# Patient Record
Sex: Female | Born: 2000 | Race: White | Hispanic: No | Marital: Single | Smoking: Never smoker
Health system: Southern US, Community
[De-identification: ages and names within clinical notes are randomized; demographics above are authoritative.]

## PROBLEM LIST (undated history)

## (undated) DIAGNOSIS — Z789 Other specified health status: Secondary | ICD-10-CM

## (undated) HISTORY — PX: NO PAST SURGERIES: SHX2092

## (undated) HISTORY — DX: Other specified health status: Z78.9

---
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
## 2021-01-03 NOTE — Telephone Encounter (Signed)
Informed mother verbalized understanding 

## 2021-01-16 ENCOUNTER — Ambulatory Visit: Payer: Medicaid Other | Admitting: Pediatrics

## 2021-01-24 ENCOUNTER — Ambulatory Visit (INDEPENDENT_AMBULATORY_CARE_PROVIDER_SITE_OTHER): Payer: Medicaid Other | Admitting: Pediatrics

## 2021-01-24 ENCOUNTER — Other Ambulatory Visit: Payer: Self-pay

## 2021-01-24 ENCOUNTER — Encounter: Payer: Self-pay | Admitting: Pediatrics

## 2021-01-24 VITALS — BP 123/77 | HR 83 | Ht 65.16 in | Wt 187.8 lb

## 2021-01-24 DIAGNOSIS — Z87440 Personal history of urinary (tract) infections: Secondary | ICD-10-CM | POA: Diagnosis not present

## 2021-01-24 DIAGNOSIS — K219 Gastro-esophageal reflux disease without esophagitis: Secondary | ICD-10-CM | POA: Diagnosis not present

## 2021-01-24 DIAGNOSIS — R141 Gas pain: Secondary | ICD-10-CM

## 2021-01-24 DIAGNOSIS — J3089 Other allergic rhinitis: Secondary | ICD-10-CM | POA: Diagnosis not present

## 2021-01-24 DIAGNOSIS — B349 Viral infection, unspecified: Secondary | ICD-10-CM

## 2021-01-24 DIAGNOSIS — J069 Acute upper respiratory infection, unspecified: Secondary | ICD-10-CM

## 2021-01-24 DIAGNOSIS — R1084 Generalized abdominal pain: Secondary | ICD-10-CM | POA: Diagnosis not present

## 2021-01-24 LAB — POCT URINALYSIS DIPSTICK (MANUAL)
Leukocytes, UA: NEGATIVE
Nitrite, UA: NEGATIVE
Poct Bilirubin: NEGATIVE
Poct Blood: NEGATIVE
Poct Glucose: NORMAL mg/dL
Poct Ketones: NEGATIVE
Poct Urobilinogen: NORMAL mg/dL
Spec Grav, UA: 1.025 (ref 1.010–1.025)
pH, UA: 6 (ref 5.0–8.0)

## 2021-01-24 LAB — POCT INFLUENZA A: Rapid Influenza A Ag: NEGATIVE

## 2021-01-24 LAB — POC SOFIA SARS ANTIGEN FIA: SARS Coronavirus 2 Ag: NEGATIVE

## 2021-01-24 LAB — POCT INFLUENZA B: Rapid Influenza B Ag: NEGATIVE

## 2021-01-24 MED ORDER — LORATADINE 10 MG PO TABS
10.0000 mg | ORAL_TABLET | Freq: Every day | ORAL | 5 refills | Status: DC
Start: 2021-01-24 — End: 2021-04-19

## 2021-01-24 MED ORDER — LANSOPRAZOLE 30 MG PO CPDR: 30.0000 mg | DELAYED_RELEASE_CAPSULE | Freq: Every day | ORAL | 2 refills | Status: DC

## 2021-01-24 MED ORDER — FLUTICASONE PROPIONATE 50 MCG/ACT NA SUSP
1.0000 | Freq: Every day | NASAL | 5 refills | Status: DC
Start: 2021-01-24 — End: 2022-09-09

## 2021-01-24 NOTE — Progress Notes (Signed)
Patient is the primary historian during today's visit.  Subjective:    Mary Morgan  is a 20 y.o. who presents for recheck of urine in addition to new symptoms of nasal congestion, headache and intermittent abdominal pain. Patient was treated for BV and notes improvement in vaginal discharge.  Patient states that her headache and nasal congestion started 1 week ago, comes and goes, usually with frontal dull pain.   Patient continues to have generalized abdominal pain, sometimes localized to the lower abdomen. Patient started on reflux medication but does not consistently take it. Patient notes that her stools are normal, no constipation.   History reviewed. No pertinent past medical history.   Past Surgical History:  Procedure Laterality Date  . NO PAST SURGERIES       Family History  Problem Relation Age of Onset  . Migraines Mother   . Anxiety disorder Mother   . Depression Mother   . Bipolar disorder Mother   . Schizophrenia Mother   . Migraines Brother   . ADD / ADHD Brother   . Seizures Neg Hx   . Autism Neg Hx     Current Meds  Medication Sig  . amitriptyline (ELAVIL) 25 MG tablet Take 1 tablet (25 mg total) by mouth at bedtime.  Marland Kitchen etonogestrel (NEXPLANON) 68 MG IMPL implant 1 each by Subdermal route once.  . fluticasone (FLONASE) 50 MCG/ACT nasal spray Place 1 spray into both nostrils daily.  Marland Kitchen loratadine (CLARITIN) 10 MG tablet Take 10 mg by mouth as needed.   . loratadine (CLARITIN) 10 MG tablet Take 1 tablet (10 mg total) by mouth daily.  . Multiple Vitamin (MULTIVITAMIN) tablet Take 1 tablet by mouth daily.  . naproxen (NAPROSYN) 500 MG tablet Take 1 tablet (500 mg total) by mouth 2 (two) times daily with a meal.  . [DISCONTINUED] lansoprazole (PREVACID) 30 MG capsule Take 1 capsule (30 mg total) by mouth daily at 12 noon.       Allergies  Allergen Reactions  . Banana Shortness Of Breath  . Penicillins Shortness Of Breath  . Elemental Sulfur Rash    Review  of Systems  Constitutional: Negative.  Negative for fever and malaise/fatigue.  HENT: Positive for congestion. Negative for ear pain and sore throat.   Eyes: Negative.   Respiratory: Negative.  Negative for cough.   Cardiovascular: Negative.  Negative for chest pain.  Gastrointestinal: Positive for abdominal pain. Negative for blood in stool, constipation, diarrhea and vomiting.  Genitourinary: Negative for dysuria, flank pain, hematuria and urgency.  Skin: Negative.  Negative for rash.  Neurological: Positive for headaches. Negative for dizziness.     Objective:   Blood pressure 123/77, pulse 83, height 5' 5.16" (1.655 m), weight 187 lb 12.8 oz (85.2 kg), last menstrual period 01/11/2021, SpO2 98 %.  Physical Exam Constitutional:      General: She is not in acute distress.    Appearance: Normal appearance.  HENT:     Head: Normocephalic and atraumatic.     Right Ear: Tympanic membrane, ear canal and external ear normal.     Left Ear: Tympanic membrane, ear canal and external ear normal.     Nose: Congestion present. No rhinorrhea.     Comments: No sinus tenderness    Mouth/Throat:     Mouth: Mucous membranes are moist.     Pharynx: Oropharynx is clear. No oropharyngeal exudate or posterior oropharyngeal erythema.  Eyes:     Conjunctiva/sclera: Conjunctivae normal.     Pupils: Pupils  are equal, round, and reactive to light.  Cardiovascular:     Rate and Rhythm: Normal rate and regular rhythm.     Heart sounds: Normal heart sounds.  Pulmonary:     Effort: Pulmonary effort is normal.     Breath sounds: Normal breath sounds.  Abdominal:     General: Bowel sounds are normal. There is no distension.     Palpations: Abdomen is soft.     Tenderness: There is no abdominal tenderness. There is no right CVA tenderness or left CVA tenderness.  Musculoskeletal:        General: Normal range of motion.     Cervical back: Normal range of motion and neck supple.  Lymphadenopathy:      Cervical: No cervical adenopathy.  Skin:    General: Skin is warm.  Neurological:     General: No focal deficit present.     Mental Status: She is alert.     Gait: Gait is intact.  Psychiatric:        Mood and Affect: Mood and affect normal.        Behavior: Behavior normal.      IN-HOUSE Laboratory Results:    Results for orders placed or performed in visit on 01/24/21  POC SOFIA Antigen FIA  Result Value Ref Range   SARS Coronavirus 2 Ag Negative Negative  POCT Influenza B  Result Value Ref Range   Rapid Influenza B Ag negative   POCT Influenza A  Result Value Ref Range   Rapid Influenza A Ag negative   POCT Urinalysis Dip Manual  Result Value Ref Range   Spec Grav, UA 1.025 1.010 - 1.025   pH, UA 6.0 5.0 - 8.0   Leukocytes, UA Negative Negative   Nitrite, UA Negative Negative   Poct Protein trace Negative, trace mg/dL   Poct Glucose Normal Normal mg/dL   Poct Ketones Negative Negative   Poct Urobilinogen Normal Normal mg/dL   Poct Bilirubin Negative Negative   Poct Blood Negative Negative, trace     Assessment:    History of urinary tract infection - Plan: POCT Urinalysis Dip Manual, Upper Respiratory Culture, Routine  Viral illness - Plan: POC SOFIA Antigen FIA, POCT Influenza B, POCT Influenza A  Gas pain  Generalized abdominal pain - Plan: DG Abd 2 Views  Seasonal allergic rhinitis due to other allergic trigger - Plan: loratadine (CLARITIN) 10 MG tablet, fluticasone (FLONASE) 50 MCG/ACT nasal spray  Gastroesophageal reflux disease without esophagitis - Plan: lansoprazole (PREVACID) 30 MG capsule  Plan:   UA WNL today. Will follow culture.   Discussed with patient the importance of compliance with medication. Advised restarting reflux medication and take daily. Will send for abdominal XR and will follow.   Discussed about allergic rhinitis. Advised family to make sure child changes clothing and washes hands/face when returning from outdoors. Air  purifier should be used. Will start on allergy medication today. This type of medication should be used every day regardless of symptoms, not on an as-needed basis. It typically takes 1 to 2 weeks to see a response.  Nasal saline may be used for congestion and to thin the secretions for easier mobilization of the secretions. A cool mist humidifier may be used. Increase the amount of fluids the child is taking in to improve hydration. Tylenol may be used as directed on the bottle. Rest is critically important to enhance the healing process and is encouraged by limiting activities.   Meds ordered this encounter  Medications  . lansoprazole (PREVACID) 30 MG capsule    Sig: Take 1 capsule (30 mg total) by mouth daily at 12 noon.    Dispense:  30 capsule    Refill:  2  . loratadine (CLARITIN) 10 MG tablet    Sig: Take 1 tablet (10 mg total) by mouth daily.    Dispense:  30 tablet    Refill:  5  . fluticasone (FLONASE) 50 MCG/ACT nasal spray    Sig: Place 1 spray into both nostrils daily.    Dispense:  16 g    Refill:  5   Advised patient to trial on Gas pills for abdominal pain.   Orders Placed This Encounter  Procedures  . Upper Respiratory Culture, Routine  . DG Abd 2 Views  . POC SOFIA Antigen FIA  . POCT Influenza B  . POCT Influenza A  . POCT Urinalysis Dip Manual

## 2021-01-25 ENCOUNTER — Ambulatory Visit (HOSPITAL_COMMUNITY)
Admission: RE | Admit: 2021-01-25 | Discharge: 2021-01-25 | Disposition: A | Discharge status: Home / Self Care | Payer: Medicaid Other | Source: Ambulatory Visit | Attending: Pediatrics | Admitting: Pediatrics

## 2021-01-25 ENCOUNTER — Encounter (HOSPITAL_COMMUNITY): Payer: Self-pay

## 2021-01-25 DIAGNOSIS — R1084 Generalized abdominal pain: Secondary | ICD-10-CM | POA: Diagnosis not present

## 2021-01-25 DIAGNOSIS — R109 Unspecified abdominal pain: Secondary | ICD-10-CM | POA: Diagnosis not present

## 2021-01-25 NOTE — Patient Instructions (Signed)

## 2021-01-26 ENCOUNTER — Telehealth: Payer: Self-pay | Admitting: Pediatrics

## 2021-01-26 LAB — UPPER RESPIRATORY CULTURE, ROUTINE

## 2021-01-26 NOTE — Telephone Encounter (Signed)
Informed patient; verbalized understanding.

## 2021-01-26 NOTE — Telephone Encounter (Signed)
Please advise patient that her abdomen XR return normal, without any abnormality of note.

## 2021-01-29 LAB — URINE CULTURE

## 2021-01-29 LAB — SPECIMEN STATUS REPORT

## 2021-02-01 ENCOUNTER — Telehealth: Payer: Self-pay | Admitting: Pediatrics

## 2021-02-01 NOTE — Telephone Encounter (Signed)
Informed mother verbalized understanding 

## 2021-02-01 NOTE — Telephone Encounter (Signed)
patient

## 2021-02-01 NOTE — Telephone Encounter (Signed)
Please advise patient that her urine culture returned negative for infection. Urine revealed a mixed flora, which could mean that it was contaminated. We will repeat her urine culture when she returns in June. Thank you.

## 2021-02-01 NOTE — Telephone Encounter (Signed)
Left message to return call 

## 2021-02-15 ENCOUNTER — Encounter: Payer: Self-pay | Admitting: Pediatrics

## 2021-03-19 ENCOUNTER — Other Ambulatory Visit: Payer: Self-pay

## 2021-03-19 ENCOUNTER — Encounter (HOSPITAL_COMMUNITY): Payer: Self-pay | Admitting: *Deleted

## 2021-03-19 ENCOUNTER — Emergency Department (HOSPITAL_COMMUNITY): Payer: Medicaid Other

## 2021-03-19 ENCOUNTER — Emergency Department (HOSPITAL_COMMUNITY)
Admission: EM | Admit: 2021-03-19 | Discharge: 2021-03-19 | Disposition: A | Discharge status: Home / Self Care | Payer: Medicaid Other | Attending: Emergency Medicine | Admitting: Emergency Medicine

## 2021-03-19 DIAGNOSIS — R5381 Other malaise: Secondary | ICD-10-CM | POA: Diagnosis not present

## 2021-03-19 DIAGNOSIS — Y92410 Unspecified street and highway as the place of occurrence of the external cause: Secondary | ICD-10-CM | POA: Insufficient documentation

## 2021-03-19 DIAGNOSIS — M25522 Pain in left elbow: Secondary | ICD-10-CM | POA: Insufficient documentation

## 2021-03-19 DIAGNOSIS — R531 Weakness: Secondary | ICD-10-CM | POA: Diagnosis not present

## 2021-03-19 DIAGNOSIS — M79602 Pain in left arm: Secondary | ICD-10-CM | POA: Diagnosis not present

## 2021-03-19 DIAGNOSIS — M25512 Pain in left shoulder: Secondary | ICD-10-CM | POA: Diagnosis not present

## 2021-03-19 DIAGNOSIS — M545 Low back pain, unspecified: Secondary | ICD-10-CM | POA: Diagnosis not present

## 2021-03-19 DIAGNOSIS — M546 Pain in thoracic spine: Secondary | ICD-10-CM | POA: Insufficient documentation

## 2021-03-19 DIAGNOSIS — Z041 Encounter for examination and observation following transport accident: Secondary | ICD-10-CM | POA: Diagnosis not present

## 2021-03-19 DIAGNOSIS — M549 Dorsalgia, unspecified: Secondary | ICD-10-CM

## 2021-03-19 MED ORDER — METHOCARBAMOL 500 MG PO TABS: 500.0000 mg | ORAL_TABLET | Freq: Every evening | ORAL | 0 refills | Status: DC | PRN

## 2021-03-19 MED ORDER — ACETAMINOPHEN 500 MG PO TABS
1000.0000 mg | ORAL_TABLET | Freq: Once | ORAL | Status: AC
  Administered 2021-03-19: 1000 mg via ORAL
  Filled 2021-03-19: qty 2
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
## 2021-03-19 NOTE — Discharge Instructions (Addendum)
Take ibuprofen 3 times a day with meals.  Do not take other anti-inflammatories at the same time (Advil, Motrin, naproxen, Aleve). You may supplement with Tylenol if you need further pain control. °Use robaxin as needed for muscle stiffness or soreness.  Have caution, this may make you tired or groggy.  Do not drive or operate heavy machinery while taking this medicine. °Use ice packs or heating pads if this helps control your pain. °You will likely have continued muscle stiffness and soreness over the next couple days.  Follow-up with primary care in 1 week if your symptoms are not improving. °Return to the emergency room if you develop vision changes, vomiting, slurred speech, numbness, loss of bowel or bladder control, or any new or worsening symptoms. ° °
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
## 2021-03-20 ENCOUNTER — Telehealth: Payer: Self-pay | Admitting: *Deleted

## 2021-03-20 NOTE — Telephone Encounter (Signed)
Transition Care Management Follow-up Telephone Call  Date of discharge and from where: 03/19/2021 - [facility]  How have you been since you were released from the hospital? "Still really sore"  Any questions or concerns? No  Items Reviewed:  Did the pt receive and understand the discharge instructions provided? Yes   Medications obtained and verified? Yes   Other? No   Any new allergies since your discharge? No   Dietary orders reviewed? No  Do you have support at home? Yes    Functional Questionnaire: (I = Independent and D = Dependent) ADLs: I  Bathing/Dressing- I  Meal Prep- I  Eating- I  Maintaining continence- I  Transferring/Ambulation- I  Managing Meds- I  Follow up appointments reviewed:   PCP Hospital f/u appt confirmed? No  - has routine appointment in June  Specialist Hospital f/u appt confirmed? No    Are transportation arrangements needed? No   If their condition worsens, is the pt aware to call PCP or go to the Emergency Dept.? Yes  Was the patient provided with contact information for the PCP's office or ED? Yes  Was to pt encouraged to call back with questions or concerns? Yes

## 2021-04-18 ENCOUNTER — Encounter: Payer: Self-pay | Admitting: Pediatrics

## 2021-04-18 ENCOUNTER — Other Ambulatory Visit: Payer: Self-pay

## 2021-04-18 ENCOUNTER — Ambulatory Visit (INDEPENDENT_AMBULATORY_CARE_PROVIDER_SITE_OTHER): Payer: Medicaid Other | Admitting: Pediatrics

## 2021-04-18 VITALS — BP 112/74 | HR 89 | Ht 64.88 in | Wt 174.6 lb

## 2021-04-18 DIAGNOSIS — Z713 Dietary counseling and surveillance: Secondary | ICD-10-CM

## 2021-04-18 DIAGNOSIS — Z0000 Encounter for general adult medical examination without abnormal findings: Secondary | ICD-10-CM | POA: Diagnosis not present

## 2021-04-18 DIAGNOSIS — K219 Gastro-esophageal reflux disease without esophagitis: Secondary | ICD-10-CM

## 2021-04-18 DIAGNOSIS — Z23 Encounter for immunization: Secondary | ICD-10-CM

## 2021-04-18 DIAGNOSIS — Z7251 High risk heterosexual behavior: Secondary | ICD-10-CM | POA: Diagnosis not present

## 2021-04-18 DIAGNOSIS — K588 Other irritable bowel syndrome: Secondary | ICD-10-CM

## 2021-04-18 DIAGNOSIS — Z87440 Personal history of urinary (tract) infections: Secondary | ICD-10-CM | POA: Diagnosis not present

## 2021-04-18 LAB — POCT URINALYSIS DIPSTICK (MANUAL)
Leukocytes, UA: NEGATIVE
Nitrite, UA: NEGATIVE
Poct Bilirubin: NEGATIVE
Poct Glucose: NORMAL mg/dL
Poct Urobilinogen: NORMAL mg/dL
Spec Grav, UA: 1.025 (ref 1.010–1.025)
pH, UA: 6 (ref 5.0–8.0)

## 2021-04-18 LAB — POCT URINE PREGNANCY: Preg Test, Ur: NEGATIVE

## 2021-04-18 NOTE — Progress Notes (Signed)
Mary Morgan is a 20 y.o. who presents for a well check. Patient is accompanied by Mary Morgan. Patient is the primary historian during today's visit.   SUBJECTIVE:  CONCERNS:   Patient continues to have recurrent epigastric abdominal pain. Pain has mild improvement with reflux medication. Patient notes that food continues to make her nauseous, sometimes she wakes up and then has abdominal pain (does not wake her from sleep). Patient also notes that her abdominal pain worsens with change in mood. Patient will have loose bowel movements but has also noticed mucus in her stools.   NUTRITION:   Milk:  None Soda/Juice/Gatorade:  None Water:  2-3 cups Solids:  Eats fruits, some vegetables, meats  EXERCISE:  None  ELIMINATION:  Voids multiple times a day; Firm stools sometimes loose    MENSTRUAL HISTORY:    Cycle:  regular Flow:  heavy for 2-3 days Duration of menses: 5 days  HOME LIFE:      Patient lives alone with Mary. Feels safe at home. No guns in the house.  SLEEP:   8 hours SAFETY:  Wears seat belt all the time.   PEER RELATIONS:  Socializes well. (+) Social media  PHQ-9 Adolescent: PHQ-Adolescent 04/18/2021  Down, depressed, hopeless 2  Decreased interest 1  Altered sleeping 0  Change in appetite 2  Tired, decreased energy 0  Feeling bad or failure about yourself 0  Trouble concentrating 1  Moving slowly or fidgety/restless 0  Suicidal thoughts 0  PHQ-Adolescent Score 6  In the past year have you felt depressed or sad most days, even if you felt okay sometimes? No  If you are experiencing any of the problems on this form, how difficult have these problems made it for you to do your work, take care of things at home or get along with other people? Somewhat difficult  Has there been a time in the past month when you have had serious thoughts about ending your own life? No  Have you ever, in your whole life, tried to kill yourself or made a suicide attempt? No       DEVELOPMENT:  SCHOOL: Graduated, no plans at this time DRIVING:  yes  Social History   Tobacco Use   Smoking status: Never   Smokeless tobacco: Never  Vaping Use   Vaping Use: Former  Substance Use Topics   Alcohol use: Not Currently   Drug use: Never    Social History   Substance and Sexual Activity  Sexual Activity Yes   Birth control/protection: Implant   Comment: Heterosexual    History reviewed. No pertinent past medical history.   Past Surgical History:  Procedure Laterality Date   NO PAST SURGERIES       Family History  Problem Relation Age of Onset   Migraines Mother    Anxiety disorder Mother    Depression Mother    Bipolar disorder Mother    Schizophrenia Mother    Migraines Brother    ADD / ADHD Brother    Seizures Neg Hx    Autism Neg Hx     Allergies  Allergen Reactions   Banana Shortness Of Breath   Penicillins Shortness Of Breath   Elemental Sulfur Rash    Current Outpatient Medications  Medication Sig Dispense Refill   amitriptyline (ELAVIL) 25 MG tablet Take 1 tablet (25 mg total) by mouth at bedtime. 30 tablet 4   etonogestrel (NEXPLANON) 68 MG IMPL implant 1 each by Subdermal route once.  fluticasone (FLONASE) 50 MCG/ACT nasal spray Place 1 spray into both nostrils daily. 16 g 5   loratadine (CLARITIN) 10 MG tablet Take 10 mg by mouth as needed.      methocarbamol (ROBAXIN) 500 MG tablet Take 1 tablet (500 mg total) by mouth at bedtime as needed for muscle spasms. 7 tablet 0   Multiple Vitamin (MULTIVITAMIN) tablet Take 1 tablet by mouth daily.     naproxen (NAPROSYN) 500 MG tablet Take 1 tablet (500 mg total) by mouth 2 (two) times daily with a meal. 10 tablet 1   lansoprazole (PREVACID) 30 MG capsule Take 1 capsule (30 mg total) by mouth daily at 12 noon. 30 capsule 11   No current facility-administered medications for this visit.       Review of Systems  Constitutional: Negative.  Negative for activity change and fever.   HENT: Negative.  Negative for ear pain, rhinorrhea and sore throat.   Eyes: Negative.  Negative for pain and redness.  Respiratory: Negative.  Negative for cough and wheezing.   Cardiovascular: Negative.  Negative for chest pain.  Gastrointestinal:  Positive for abdominal pain and diarrhea. Negative for blood in stool and vomiting.  Endocrine: Negative.   Genitourinary:  Negative for difficulty urinating and dysuria.  Musculoskeletal: Negative.  Negative for back pain and joint swelling.  Skin: Negative.  Negative for rash.  Neurological: Negative.   Psychiatric/Behavioral: Negative.  Negative for suicidal ideas.     OBJECTIVE:  Wt Readings from Last 3 Encounters:  04/18/21 174 lb 9.6 oz (79.2 kg) (94 %, Z= 1.53)*  03/19/21 185 lb (83.9 kg) (96 %, Z= 1.73)*  01/24/21 187 lb 12.8 oz (85.2 kg) (96 %, Z= 1.78)*   * Growth percentiles are based on CDC (Girls, 2-20 Years) data.   Ht Readings from Last 3 Encounters:  04/18/21 5' 4.88" (1.648 m) (59 %, Z= 0.24)*  03/19/21 5' 4.5" (1.638 m) (54 %, Z= 0.09)*  01/24/21 5' 5.16" (1.655 m) (64 %, Z= 0.35)*   * Growth percentiles are based on CDC (Girls, 2-20 Years) data.    Body mass index is 29.16 kg/m.   93 %ile (Z= 1.46) based on CDC (Girls, 2-20 Years) BMI-for-age based on BMI available as of 04/18/2021.  VITALS:  Blood pressure 112/74, pulse 89, height 5' 4.88" (1.648 m), weight 174 lb 9.6 oz (79.2 kg), SpO2 100 %.   Hearing Screening   500Hz  1000Hz  2000Hz  3000Hz  4000Hz  5000Hz  6000Hz  8000Hz   Right ear 20 20 20 20 20 20 20 20   Left ear 20 20 20 20 20 20 20 20    Vision Screening   Right eye Left eye Both eyes  Without correction     With correction 20/20 20/20 20/20      PHYSICAL EXAM: GEN:  Alert, active, no acute distress PSYCH:  Mood: pleasant;  Affect:  full range HEENT:  Normocephalic.  Atraumatic. Optic discs sharp bilaterally. Pupils equally round and reactive to light.  Extraoccular muscles intact.  Tympanic canals  clear. Tympanic membranes are pearly gray bilaterally.   Turbinates:  normal ; Tongue midline. No pharyngeal lesions.  Dentition normal. NECK:  Supple. Full range of motion.  No thyromegaly.  No lymphadenopathy. CARDIOVASCULAR:  Normal S1, S2.  No murmurs.   CHEST: Normal shape.  SMR V LUNGS: Clear to auscultation.   ABDOMEN:  Normoactive polyphonic bowel sounds.  No masses.  No hepatosplenomegaly. EXTERNAL GENITALIA:  Normal SMR V EXTREMITIES:  Full ROM. No cyanosis.  No edema. SKIN:  Well perfused.  No rash NEURO:  +5/5 Strength. CN II-XII intact. Normal gait cycle.   SPINE:  No deformities.  No scoliosis.    ASSESSMENT/PLAN:    Tyja is a 20 y.o. teen here for [facility]. Patient is alert, active and in NAD. Passed hearing and vision screen. Growth curve reviewed. Immunizations today.   PHQ-9 reviewed with patient. No suicidal or homicidal ideations.   GC/Ch screen sent. Results will be discussed with patient.    IMMUNIZATIONS:  Handout (VIS) provided for each vaccine for the patient to review during this visit. Indications, benefits, contraindications, and side effects of vaccines discussed with parent.  Patient verbally expressed understanding.  Patient consented to the administration of vaccine/vaccines as ordered today.   Orders Placed This Encounter  Procedures   Chlamydia/GC NAA, Confirmation   Urine Culture   Meningococcal B, OMV (Bexsero)   Ambulatory referral to Gastroenterology   POCT Urinalysis Dip Manual   POCT urine pregnancy   Discussed IBS and GERD with patient. Will continue on oral medication and referral to GI sent.   Meds ordered this encounter  Medications   lansoprazole (PREVACID) 30 MG capsule    Sig: Take 1 capsule (30 mg total) by mouth daily at 12 noon.    Dispense:  30 capsule    Refill:  11   Anticipatory Guidance     - Handout on Young Adult Safety given.      - Discussed growth, diet, and exercise.    - Discussed social media use and limiting  screen time to 2 hours daily.    - Discussed dangers of substance use.    - Discussed lifelong adult responsibility of pregnancy, STDs, and safe sex practices including abstinence.     - Taught self-breast exam.  Taught self-testicular exam.

## 2021-04-19 ENCOUNTER — Encounter: Payer: Self-pay | Admitting: Pediatrics

## 2021-04-19 MED ORDER — LANSOPRAZOLE 30 MG PO CPDR: 30.0000 mg | DELAYED_RELEASE_CAPSULE | Freq: Every day | ORAL | 11 refills | Status: DC

## 2021-04-19 NOTE — Patient Instructions (Signed)
Well Child Nutrition, Teen This sheet provides general nutrition recommendations. Talk with a health care provider or a diet and nutrition specialist (dietitian) if you have any questions. Nutrition The amount of food you need to eat every day depends on your age, sex, size, and activity level. To figure out your daily calorie needs, look for a calorie calculator online or talk with your health care provider. Balanced diet Eat a balanced diet. Try to include: Fruits. Aim for 1-2 cups a day. Examples of 1 cup of fruit include 1 large banana, 1 small apple, 8 large strawberries, or 1 large orange. Try to eat fresh or frozen fruits, and avoid fruits that have added sugars. Vegetables. Aim for 2-3 cups a day. Examples of 1 cup of vegetables include 2 medium carrots, 1 large tomato, or 2 stalks of celery. Try to eat vegetables with a variety of colors. Low-fat dairy. Aim for 3 cups a day. Examples of 1 cup of dairy include 8 oz (230 mL) of milk, 8 oz (230 g) of yogurt, or 1 oz (44 g) of natural cheese. Getting enough calcium and vitamin D is important for growth and healthy bones. Include fat-free or low-fat milk, cheese, and yogurt in your diet. If you are unable to tolerate dairy (lactose intolerant) or you choose not to consume dairy, you may include fortified soy beverages (soy milk). Whole grains. Of the grain foods that you eat each day (such as pasta, rice, and tortillas), aim to include 6-8 "ounce-equivalents" of whole-grain options. Examples of 1 ounce-equivalent of whole grains include 1 cup of whole-wheat cereal,  cup of brown rice, or 1 slice of whole-wheat bread. Lean proteins. Aim for 5-6 "ounce-equivalents" a day. Eat a variety of protein foods, including lean meats, seafood, poultry, eggs, legumes (beans and peas), nuts, seeds, and soy products. A cut of meat or fish that is the size of a deck of cards is about 3-4 ounce-equivalents. Foods that provide 1 ounce-equivalent of protein  include 1 egg,  cup of nuts or seeds, or 1 tablespoon (16 g) of peanut butter. For more information and options for foods in a balanced diet, visit www.choosemyplate.gov Tips for healthy snacking A snack should not be the size of a full meal. Eat snacks that have 200 calories or less. Examples include:  whole-wheat pita with  cup hummus. 2 or 3 slices of deli turkey wrapped around one cheese stick.  apple with 1 tablespoon of peanut butter. 10 baked chips with salsa. Keep cut-up fruits and vegetables available at home and at school so they are easy to eat. Pack healthy snacks the night before or when you pack your lunch. Avoid pre-packaged foods. These tend to be higher in fat, sugar, and salt (sodium). Get involved with shopping, or ask the main food shopper in your family to get healthy snacks that you like. Avoid chips, candy, cake, and soft drinks. Foods to avoid Fried or heavily processed foods, such as hot dogs and microwaveable dinners. Drinks that contain a lot of sugar, such as sports drinks, sodas, and juice. Foods that contain a lot of fat, salt (sodium), or sugar. General instructions Make time for regular exercise. Try to be active for 60 minutes every day. Drink plenty of water, especially while you are playing sports or exercising. Do not skip meals, especially breakfast. Avoid overeating. Eat when you are hungry, and stop eating when you are full. Do not hesitate to try new foods. Help with meal prep and learn how to   prepare meals. Avoid fad diets. These may affect your mood and growth. If you are worried about your body image, talk with your parents, your health care provider, or another trusted adult like a coach or counselor. You may be at risk for developing an eating disorder. Eating disorders can lead to serious medical problems. Food allergies may cause you to have a reaction (such as a rash, diarrhea, or vomiting) after eating or drinking. Talk with your health  care provider if you have concerns about food allergies. Summary Eat a balanced diet. Include whole grains, fruits, vegetables, proteins, and low-fat dairy. Choose healthy snacks that are 200 calories or less. Drink plenty of water. Be active for 60 minutes or more every day. This information is not intended to replace advice given to you by your health care provider. Make sure you discuss any questions you have with your health care provider. Document Revised: 10/04/2020 Document Reviewed: 10/04/2020 Elsevier Patient Education  2022 [employer] Inc.  

## 2021-04-20 LAB — CHLAMYDIA/GC NAA, CONFIRMATION
Chlamydia trachomatis, NAA: NEGATIVE
Neisseria gonorrhoeae, NAA: NEGATIVE

## 2021-04-21 LAB — URINE CULTURE

## 2021-04-23 ENCOUNTER — Encounter: Payer: Self-pay | Admitting: Pediatrics

## 2021-04-23 ENCOUNTER — Telehealth: Payer: Self-pay | Admitting: Pediatrics

## 2021-04-23 NOTE — Telephone Encounter (Signed)
Please advise patient that her Gonorrhea and Chlamydia results were negative. Patient's urine culture reviewed mixed flora. No antibiotics at this time.

## 2021-04-24 NOTE — Telephone Encounter (Signed)
Informed patient; verbalized understanding.

## 2021-04-26 ENCOUNTER — Encounter: Payer: Self-pay | Admitting: Internal Medicine

## 2021-05-29 DIAGNOSIS — H5213 Myopia, bilateral: Secondary | ICD-10-CM | POA: Diagnosis not present

## 2021-08-17 ENCOUNTER — Encounter: Payer: Self-pay | Admitting: Internal Medicine

## 2021-08-17 ENCOUNTER — Ambulatory Visit: Payer: Medicaid Other | Admitting: Gastroenterology

## 2022-02-26 ENCOUNTER — Emergency Department (HOSPITAL_BASED_OUTPATIENT_CLINIC_OR_DEPARTMENT_OTHER): Payer: Medicaid Other

## 2022-02-26 ENCOUNTER — Emergency Department (HOSPITAL_BASED_OUTPATIENT_CLINIC_OR_DEPARTMENT_OTHER)
Admission: EM | Admit: 2022-02-26 | Discharge: 2022-02-26 | Disposition: A | Discharge status: Home / Self Care | Payer: Medicaid Other | Attending: Emergency Medicine | Admitting: Emergency Medicine

## 2022-02-26 ENCOUNTER — Other Ambulatory Visit: Payer: Self-pay

## 2022-02-26 ENCOUNTER — Encounter (HOSPITAL_BASED_OUTPATIENT_CLINIC_OR_DEPARTMENT_OTHER): Payer: Self-pay

## 2022-02-26 DIAGNOSIS — N3000 Acute cystitis without hematuria: Secondary | ICD-10-CM | POA: Diagnosis not present

## 2022-02-26 DIAGNOSIS — R102 Pelvic and perineal pain: Secondary | ICD-10-CM | POA: Insufficient documentation

## 2022-02-26 DIAGNOSIS — R1030 Lower abdominal pain, unspecified: Secondary | ICD-10-CM | POA: Diagnosis present

## 2022-02-26 DIAGNOSIS — R109 Unspecified abdominal pain: Secondary | ICD-10-CM | POA: Diagnosis not present

## 2022-02-26 LAB — PREGNANCY, URINE: Preg Test, Ur: NEGATIVE

## 2022-02-26 LAB — LIPASE, BLOOD: Lipase: 14 U/L (ref 11–51)

## 2022-02-26 LAB — COMPREHENSIVE METABOLIC PANEL
ALT: 16 U/L (ref 0–44)
AST: 18 U/L (ref 15–41)
Albumin: 5.1 g/dL — ABNORMAL HIGH (ref 3.5–5.0)
Alkaline Phosphatase: 64 U/L (ref 38–126)
Anion gap: 11 (ref 5–15)
BUN: 8 mg/dL (ref 6–20)
CO2: 22 mmol/L (ref 22–32)
Calcium: 10.2 mg/dL (ref 8.9–10.3)
Chloride: 106 mmol/L (ref 98–111)
Creatinine, Ser: 0.77 mg/dL (ref 0.44–1.00)
GFR, Estimated: >60 mL/min (ref >60)
Glucose, Bld: 93 mg/dL (ref 70–99)
Potassium: 3.9 mmol/L (ref 3.5–5.1)
Sodium: 139 mmol/L (ref 135–145)
Total Bilirubin: 0.5 mg/dL (ref 0.3–1.2)
Total Protein: 8.2 g/dL — ABNORMAL HIGH (ref 6.5–8.1)

## 2022-02-26 LAB — URINALYSIS, ROUTINE W REFLEX MICROSCOPIC
Bilirubin Urine: NEGATIVE
Glucose, UA: NEGATIVE mg/dL
Hgb urine dipstick: NEGATIVE
Ketones, ur: 15 mg/dL — AB
Leukocytes,Ua: NEGATIVE
Nitrite: NEGATIVE
Specific Gravity, Urine: 1.028 (ref 1.005–1.030)
pH: 5.5 (ref 5.0–8.0)

## 2022-02-26 LAB — CBC
HCT: 45.2 % (ref 36.0–46.0)
Hemoglobin: 15.4 g/dL — ABNORMAL HIGH (ref 12.0–15.0)
MCH: 30.4 pg (ref 26.0–34.0)
MCHC: 34.1 g/dL (ref 30.0–36.0)
MCV: 89.3 fL (ref 80.0–100.0)
Platelets: 263 10*3/uL (ref 150–400)
RBC: 5.06 MIL/uL (ref 3.87–5.11)
RDW: 12.3 % (ref 11.5–15.5)
WBC: 8.7 10*3/uL (ref 4.0–10.5)
nRBC: 0 % (ref 0.0–0.2)

## 2022-02-26 MED ORDER — KETOROLAC TROMETHAMINE 30 MG/ML IJ SOLN
30.0000 mg | Freq: Once | INTRAMUSCULAR | Status: AC
  Administered 2022-02-26: 30 mg via INTRAVENOUS
  Filled 2022-02-26: qty 1

## 2022-02-26 MED ORDER — NITROFURANTOIN MONOHYD MACRO 100 MG PO CAPS: 100.0000 mg | ORAL_CAPSULE | Freq: Two times a day (BID) | ORAL | 0 refills | Status: DC

## 2022-02-26 MED ORDER — IOHEXOL 300 MG/ML  SOLN
100.0000 mL | Freq: Once | INTRAMUSCULAR | Status: AC | PRN
  Administered 2022-02-26: 75 mL via INTRAVENOUS

## 2022-02-26 NOTE — ED Notes (Signed)
Patient transported to CT 

## 2022-02-26 NOTE — ED Triage Notes (Signed)
Patient here POV at Home. ? ?Endorses Suprapubic ABD Pain that began approximately 1 hour ago while at work. ? ?No Other Associated Symptoms.  ? ?NAD Noted during Triage. A&Ox4. GCS 15. Ambulatory. ?

## 2022-02-26 NOTE — Discharge Instructions (Addendum)
It was a pleasure taking care of you today!  ? ?Your labs and imaging were unremarkable. Your urine will be sent for culture.  Due to your symptoms you will be treated for a UTI with Macrobid.  Take as prescribed. Ensure that you complete the entire course of antibiotic. Ensure to maintain fluid intake.  You may follow-up with your primary care provider as needed.  Return to the emergency department if you are experiencing increasing or worsening abdominal pain, urinary symptoms, vomiting, fever, or decreased fluid intake. ?

## 2022-02-26 NOTE — ED Notes (Signed)
Pt wanted to let let provider know that she had 2 syncopal episodes in the last 30 days. Pt also felt like her heart beat was erratic. ?

## 2022-02-26 NOTE — ED Provider Notes (Signed)
?[facility] EMERGENCY DEPT ?Provider Note ? ? ?CSN: [id number] ?Arrival date & time: 02/26/22  1149 ? ?  ? ?History ? ?Chief Complaint  ?Patient presents with  ? Abdominal Pain  ? ? ?Mary Morgan is a 21 y.o. female who presents to the emergency department complaint of suprapubic abdominal pain onset 1 hour ago prior to arrival.  Patient notes that there is an increased pressure when she is urinating.  Denies sick contacts.  Has not taken any medication for symptoms.  Denies fever, chills, nausea, vomiting, hematuria, vaginal bleeding, vaginal discharge.  She is sexually active with 1 partner with unprotected intercourse.  Denies concerns for STI at this time. ? ?The history is provided by the patient. No language interpreter was used.  ? ?  ? ?Home Medications ?Prior to Admission medications   ?Medication Sig Start Date End Date Taking? Authorizing Provider  ?etonogestrel (NEXPLANON) 68 MG IMPL implant 1 each by Subdermal route once.   Yes [provider]  ?ibuprofen (ADVIL) 200 MG tablet Take 200 mg by mouth every 6 (six) hours as needed.   Yes [provider]  ?nitrofurantoin, macrocrystal-monohydrate, (MACROBID) 100 MG capsule Take 1 capsule (100 mg total) by mouth 2 (two) times daily. 02/26/22  Yes Kalila Adkison A, PA-C  ?amitriptyline (ELAVIL) 25 MG tablet Take 1 tablet (25 mg total) by mouth at bedtime. ?Patient not taking: Reported on 02/26/2022 06/17/19   Keturah Shavers, MD  ?fluticasone [facility]) 50 MCG/ACT nasal spray Place 1 spray into both nostrils daily. ?Patient not taking: Reported on 02/26/2022 01/24/21   Vella Kohler, MD  ?lansoprazole (PREVACID) 30 MG capsule Take 1 capsule (30 mg total) by mouth daily at 12 noon. 04/19/21 05/19/21  Vella Kohler, MD  ?methocarbamol (ROBAXIN) 500 MG tablet Take 1 tablet (500 mg total) by mouth at bedtime as needed for muscle spasms. ?Patient not taking: Reported on 02/26/2022 03/19/21   Caccavale, Sophia, PA-C  ?naproxen (NAPROSYN)  500 MG tablet Take 1 tablet (500 mg total) by mouth 2 (two) times daily with a meal. ?Patient not taking: Reported on 02/26/2022 12/06/20   Vella Kohler, MD  ?   ? ?Allergies    ?Banana, Penicillins, and Elemental sulfur   ? ?Review of Systems   ?Review of Systems  ?Constitutional:  Negative for chills and fever.  ?Gastrointestinal:  Positive for abdominal pain. Negative for nausea and vomiting.  ?Genitourinary:  Positive for dysuria. Negative for hematuria, vaginal bleeding and vaginal discharge.  ?All other systems reviewed and are negative. ? ?Physical Exam ?Updated Vital Signs ?BP 132/86 (BP Location: Right Arm)   Pulse 95   Temp 97.9 ?F (36.6 ?C) (Oral)   Resp 16   Ht 5' 4.5" (1.638 m)   Wt 68 kg   SpO2 100%   BMI 25.35 kg/m?  ?Physical Exam ?Vitals and nursing note reviewed.  ?Constitutional:   ?   General: She is not in acute distress. ?   Appearance: She is not diaphoretic.  ?HENT:  ?   Head: Normocephalic and atraumatic.  ?   Mouth/Throat:  ?   Pharynx: No oropharyngeal exudate.  ?Eyes:  ?   General: No scleral icterus. ?   Conjunctiva/sclera: Conjunctivae normal.  ?Cardiovascular:  ?   Rate and Rhythm: Normal rate and regular rhythm.  ?   Pulses: Normal pulses.  ?   Heart sounds: Normal heart sounds.  ?Pulmonary:  ?   Effort: Pulmonary effort is normal. No respiratory distress.  ?  Breath sounds: Normal breath sounds. No wheezing.  ?Abdominal:  ?   General: Bowel sounds are normal.  ?   Palpations: Abdomen is soft. There is no mass.  ?   Tenderness: There is abdominal tenderness in the suprapubic area. There is no guarding or rebound.  ?Musculoskeletal:     ?   General: Normal range of motion.  ?   Cervical back: Normal range of motion and neck supple.  ?Skin: ?   General: Skin is warm and dry.  ?Neurological:  ?   Mental Status: She is alert.  ?Psychiatric:     ?   Behavior: Behavior normal.  ? ? ?ED Results / Procedures / Treatments   ?Labs ?(all labs ordered are listed, but only abnormal results  are displayed) ?Labs Reviewed  ?COMPREHENSIVE METABOLIC PANEL - Abnormal; Notable for the following components:  ?    Result Value  ? Total Protein 8.2 (*)   ? Albumin 5.1 (*)   ? All other components within normal limits  ?CBC - Abnormal; Notable for the following components:  ? Hemoglobin 15.4 (*)   ? All other components within normal limits  ?URINALYSIS, ROUTINE W REFLEX MICROSCOPIC - Abnormal; Notable for the following components:  ? Ketones, ur 15 (*)   ? Protein, ur TRACE (*)   ? All other components within normal limits  ?URINE CULTURE  ?LIPASE, BLOOD  ?PREGNANCY, URINE  ? ? ?EKG ?None ? ?Radiology ?CT Abdomen Pelvis W Contrast ? ?Result Date: 02/26/2022 ?CLINICAL DATA:  Acute suprapubic pelvic pain. EXAM: CT ABDOMEN AND PELVIS WITH CONTRAST TECHNIQUE: Multidetector CT imaging of the abdomen and pelvis was performed using the standard protocol following bolus administration of intravenous contrast. RADIATION DOSE REDUCTION: This exam was performed according to the departmental dose-optimization program which includes automated exposure control, adjustment of the mA and/or kV according to patient size and/or use of iterative reconstruction technique. CONTRAST:  46mL OMNIPAQUE IOHEXOL 300 MG/ML  SOLN COMPARISON:  August 07, 2020. FINDINGS: Lower chest: No acute abnormality. Hepatobiliary: No focal liver abnormality is seen. No gallstones, gallbladder wall thickening, or biliary dilatation. Pancreas: Unremarkable. No pancreatic ductal dilatation or surrounding inflammatory changes. Spleen: Normal in size without focal abnormality. Adrenals/Urinary Tract: Adrenal glands are unremarkable. Kidneys are normal, without renal calculi, focal lesion, or hydronephrosis. Bladder is unremarkable. Stomach/Bowel: Stomach is within normal limits. Appendix appears normal. No evidence of bowel wall thickening, distention, or inflammatory changes. Vascular/Lymphatic: No significant vascular findings are present. No enlarged  abdominal or pelvic lymph nodes. Reproductive: Uterus is unremarkable. Left adnexal region is unchanged. Right ovary appears to be enlarged and somewhat edematous compared to prior exam. Other: No abdominal wall hernia or abnormality. Small amount of free fluid is noted in dependent portion of pelvis which may be physiologic. Musculoskeletal: No acute or significant osseous findings. IMPRESSION: Right ovary appears to be enlarged and somewhat edematous compared to prior exam. Pelvic ultrasound is recommended to rule out the possibility of ovarian torsion. Electronically Signed   By: Lupita Raider M.D.   On: 02/26/2022 14:10  ? ?US PELVIC COMPLETE W TRANSVAGINAL AND TORSION R/O ? ?Result Date: 02/26/2022 ?CLINICAL DATA:  Pelvic pain.  Abnormal CT EXAM: TRANSABDOMINAL ULTRASOUND OF PELVIS DOPPLER ULTRASOUND OF OVARIES TECHNIQUE: Transabdominal ultrasound examination of the pelvis was performed including evaluation of the uterus, ovaries, adnexal regions, and pelvic cul-de-sac. Color and duplex Doppler ultrasound was utilized to evaluate blood flow to the ovaries. COMPARISON:  CT abdomen pelvis 02/26/2022 FINDINGS: Uterus Measurements: 7.4  x 3.7 x 3.9 cm = volume: 56 mL. No fibroids or other mass visualized. Endometrium Thickness: 6.8 mm.  No focal abnormality visualized. Right ovary Measurements: 3.4 x 2.5 x 3.7 cm = volume: 16.6 mL. Cystic change within the right ovary measuring 3 x 1.0 x 2.5 cm. Hypervascularity on Doppler consistent with corpus luteum cyst. Left ovary Measurements: 3.1 x 1.4 x 2.3 cm = volume: 5.2 mL. Normal appearance/no adnexal mass. Pulsed Doppler evaluation demonstrates hypervascularity in the right ovary. Normal vascularity left ovary. Negative for torsion Other: Small free fluid in the pelvis IMPRESSION: Mild enlargement of the right ovary. Internal cystic change with hypervascularity compatible with corpus luteum cyst. This corresponds to the CT finding. Small free fluid. Electronically  Signed   By: Marlan Palauharles  Clark M.D.   On: 02/26/2022 15:26   ? ?Procedures ?Procedures  ? ? ?Medications Ordered in ED ?Medications  ?iohexol (OMNIPAQUE) 300 MG/ML solution 100 mL (75 mLs Intravenous Contrast Given 5/

## 2022-02-27 ENCOUNTER — Telehealth: Payer: Self-pay

## 2022-02-27 LAB — URINE CULTURE: Culture: NO GROWTH

## 2022-02-27 NOTE — Telephone Encounter (Signed)
Transition Care Management Unsuccessful Follow-up Telephone Call ? ?Date of discharge and from where:  02/26/2022 from [place] ? ?Attempts:  1st Attempt ? ?Reason for unsuccessful TCM follow-up call:  Unable to leave message ? ? ? ?
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
## 2022-02-28 NOTE — Telephone Encounter (Signed)
Transition Care Management Unsuccessful Follow-up Telephone Call ? ?Date of discharge and from where:  02/26/2022 from [facility] ? ?Attempts:  2nd Attempt ? ?Reason for unsuccessful TCM follow-up call:  Unable to leave message ? ? ? ?

## 2022-03-01 NOTE — Telephone Encounter (Signed)
Transition Care Management Unsuccessful Follow-up Telephone Call ? ?Date of discharge and from where:  02/26/2022-[facility] ? ?Attempts:  3rd Attempt ? ?Reason for unsuccessful TCM follow-up call:  Unable to leave message ? ?  ?

## 2022-03-21 ENCOUNTER — Ambulatory Visit (HOSPITAL_BASED_OUTPATIENT_CLINIC_OR_DEPARTMENT_OTHER): Payer: Medicaid Other | Admitting: Family Medicine

## 2022-05-07 ENCOUNTER — Ambulatory Visit (HOSPITAL_BASED_OUTPATIENT_CLINIC_OR_DEPARTMENT_OTHER): Payer: Medicaid Other | Admitting: Family Medicine

## 2022-05-17 ENCOUNTER — Encounter (HOSPITAL_BASED_OUTPATIENT_CLINIC_OR_DEPARTMENT_OTHER): Payer: Self-pay | Admitting: Family Medicine

## 2022-07-03 ENCOUNTER — Ambulatory Visit (INDEPENDENT_AMBULATORY_CARE_PROVIDER_SITE_OTHER): Payer: Medicaid Other | Admitting: Family Medicine

## 2022-07-03 ENCOUNTER — Encounter: Payer: Self-pay | Admitting: Family Medicine

## 2022-07-03 ENCOUNTER — Telehealth: Payer: Self-pay | Admitting: *Deleted

## 2022-07-03 VITALS — BP 120/70 | HR 91 | Temp 98.5°F | Ht 64.5 in | Wt 180.4 lb

## 2022-07-03 DIAGNOSIS — Z114 Encounter for screening for human immunodeficiency virus [HIV]: Secondary | ICD-10-CM

## 2022-07-03 DIAGNOSIS — Z113 Encounter for screening for infections with a predominantly sexual mode of transmission: Secondary | ICD-10-CM | POA: Diagnosis not present

## 2022-07-03 DIAGNOSIS — F411 Generalized anxiety disorder: Secondary | ICD-10-CM

## 2022-07-03 DIAGNOSIS — Z1159 Encounter for screening for other viral diseases: Secondary | ICD-10-CM | POA: Diagnosis not present

## 2022-07-03 DIAGNOSIS — G8929 Other chronic pain: Secondary | ICD-10-CM | POA: Diagnosis not present

## 2022-07-03 DIAGNOSIS — Z0000 Encounter for general adult medical examination without abnormal findings: Secondary | ICD-10-CM

## 2022-07-03 DIAGNOSIS — R109 Unspecified abdominal pain: Secondary | ICD-10-CM | POA: Diagnosis not present

## 2022-07-03 MED ORDER — AMITRIPTYLINE HCL 10 MG PO TABS: 10.0000 mg | ORAL_TABLET | Freq: Every day | ORAL | 1 refills | Status: DC

## 2022-07-03 NOTE — Telephone Encounter (Signed)
Pt in office for appt with Dr Carmelia Roller today and left without completing lab / swab orders. Attempted to reach pt by phone and received no answer and voicemail had not been set up yet.  Sent mychart message to call and schedule a lab appt if she is not able to return to the office today to complete labs.

## 2022-07-03 NOTE — Progress Notes (Signed)
Chief Complaint  Patient presents with   New Patient (Initial Visit)    Referral to GYN for ovarian cyst Referral Gastro or discuss if better options for her. Referral Endodontist Labs today.   Light headed and vomiting at times. Anxiety Nexplanon removal/and insertion     Well Woman Atl[place] D Brislin is here for a complete physical.   Her last physical was >1 year ago.  Current diet: in general, diet is fair. Current exercise: walking. Contraception? Yes Fatigue out of ordinary? No Seatbelt? Yes  Health Maintenance Tetanus- Yes HIV screening- No Hep C screening- No  Digestive issues 2 years, gets worse w higher anxiety levels. She will get bad stomach cramps, diarrhea, clear discharge from rectum, N/V, dysgeusia.  She denies any fevers, bleeding, nighttime awakenings, or unintentional weight loss.  She has never tried any medicine at home.  She does have a family history of Crohn's disease to her brother.  Her mother notes that this presentation is worked quite different between them though.  She has never seen a gastroenterologist.  Anxiety Not currently on a medication or seeing a specialist. Affects her gut. Affecting her sleep, impulsivity, irritability, concentration, racing thoughts. No self medication or SI/HI. +famhx of mental health issues.   Past Medical History:  Diagnosis Date   No known health problems     Past Surgical History:  Procedure Laterality Date   NO PAST SURGERIES      Medications  Current Outpatient Medications on File Prior to Visit  Medication Sig Dispense Refill   etonogestrel (NEXPLANON) 68 MG IMPL implant 1 each by Subdermal route once.     ibuprofen (ADVIL) 200 MG tablet Take 200 mg by mouth every 6 (six) hours as needed.     fluticasone (FLONASE) 50 MCG/ACT nasal spray Place 1 spray into both nostrils daily. (Patient not taking: Reported on 02/26/2022) 16 g 5   lansoprazole (PREVACID) 30 MG capsule Take 1 capsule (30 mg total) by mouth  daily at 12 noon. 30 capsule 11    Allergies Allergies  Allergen Reactions   Banana Shortness Of Breath   Penicillins Shortness Of Breath   Elemental Sulfur Rash    Review of Systems: Constitutional:  no unexpected weight changes Eye:  no recent significant change in vision Ear/Nose/Mouth/Throat:  Ears:  no tinnitus or vertigo and no recent change in hearing Nose/Mouth/Throat:  no complaints of nasal congestion, no sore throat Cardiovascular: no chest pain Respiratory:  no cough and no shortness of breath Gastrointestinal:  As noted in HPI GU:  Female: negative for dysuria or pelvic pain Musculoskeletal/Extremities:  no pain of the joints Integumentary (Skin/Breast):  no abnormal skin lesions reported Neurologic:  no headaches Endocrine:  denies fatigue Hematologic/Lymphatic:  No areas of easy bleeding  Exam BP 120/70   Pulse 91   Temp 98.5 F (36.9 C) (Oral)   Ht 5' 4.5" (1.638 m)   Wt 180 lb 6 oz (81.8 kg)   SpO2 99%   BMI 30.48 kg/m  General:  well developed, well nourished, in no apparent distress Skin:  no significant moles, warts, or growths Head:  no masses, lesions, or tenderness Eyes:  pupils equal and round, sclera anicteric without injection Ears:  canals without lesions, TMs shiny without retraction, no obvious effusion, no erythema Nose:  nares patent, septum midline, mucosa normal, and no drainage or sinus tenderness Throat/Pharynx:  lips and gingiva without lesion; tongue and uvula midline; non-inflamed pharynx; no exudates or postnasal drainage Neck: neck supple without  adenopathy, thyromegaly, or masses Lungs:  clear to auscultation, breath sounds equal bilaterally, no respiratory distress Cardio:  regular rate and rhythm, no bruits, no LE edema Abdomen:  abdomen soft, diffuse ttp; bowel sounds normal; no masses or organomegaly Genital: Defer to GYN Musculoskeletal:  symmetrical muscle groups noted without atrophy or deformity Extremities:  no  clubbing, cyanosis, or edema, no deformities, no skin discoloration Neuro:  gait normal; deep tendon reflexes normal and symmetric Psych: well oriented with normal range of affect and appropriate judgment/insight  Assessment and Plan  Well adult exam - Plan: CBC, Comprehensive metabolic panel, Lipid panel  Encounter for hepatitis C screening test for low risk patient - Plan: Hepatitis C antibody  Screening for HIV without presence of risk factors - Plan: HIV Antibody (routine testing w rflx)  Chronic abdominal pain - Plan: amitriptyline (ELAVIL) 10 MG tablet  GAD (generalized anxiety disorder) - Plan: amitriptyline (ELAVIL) 10 MG tablet  Routine screening for STI (sexually transmitted infection) - Plan: Cervicovaginal ancillary only( [place])   Well 21 y.o. female. Counseled on diet and exercise. Other orders as above. HPV vaccine recd, [facility]  Chronic abdominal pain: Chronic, uncontrolled.  Could be related to anxiety, start Elavil 10 mg nightly.  If no improvement the next month, would consider an SNRI and referral to gastroenterology. Anxiety: Chronic, unstable.  Start Elavil 10 mg nightly.  Counseling information provided in HPI.  Anxiety coping techniques verbalized and written down. Follow up in 1 mo. The patient voiced understanding and agreement to the plan.  Jilda Roche [place], DO 07/03/22 11:49 AM

## 2022-07-03 NOTE — Patient Instructions (Addendum)
Call Center for [facility] at [facility] at [phone] for an appointment.  They are located at [address], [address], [place], [place], [address] (right across the hall from our office).  Please consider counseling. Contact [phone] to schedule an appointment or inquire about cost/insurance coverage.  [facility] located at [address], [address], [place], [place].  Phone number = [phone].  Dr. Regan Lemming - Adult Psychiatry.    [facility] located at [address] [place], [place], [place]. Phone number = [phone].   [facility] located at [address] [place], [place].  Phone number = [phone].   [facility] located at [address] [place], [place], [place].  Phone number = [phone].  Give [place] 2-3 business days to get the results of your labs back.   Keep the diet clean and stay active.  Aim to do some physical exertion for 150 minutes per week. This is typically divided into 5 days per week, 30 minutes per day. The activity should be enough to get your heart rate up. Anything is better than nothing if you have time constraints.  I recommend getting the flu shot in mid October. This suggestion would change if the CDC comes out with a different recommendation.   Go to your Health Department for your HPV shot. (3 shot series).   Coping skills Choose 5 that work for you: Take a deep breath Count to 20 Read a book Do a puzzle Meditate Bake Sing Knit Garden Pray Go outside Call a friend Listen to music Take a walk Color Send a note Take a bath Watch a movie Be alone in a quiet place Pet an animal Visit a friend Journal Exercise Stretch

## 2022-07-05 NOTE — Telephone Encounter (Signed)
Spoke with pt. She will return Monday morning to complete labs ordered at [facility] on 07/03/22.

## 2022-07-05 NOTE — Addendum Note (Signed)
Addended by: Mervin Kung A on: 07/05/2022 10:56 AM   Modules accepted: Orders

## 2022-07-08 ENCOUNTER — Other Ambulatory Visit (INDEPENDENT_AMBULATORY_CARE_PROVIDER_SITE_OTHER): Payer: Medicaid Other

## 2022-07-08 ENCOUNTER — Other Ambulatory Visit (HOSPITAL_COMMUNITY)
Admission: RE | Admit: 2022-07-08 | Discharge: 2022-07-08 | Disposition: A | Discharge status: Home / Self Care | Payer: Medicaid Other | Source: Ambulatory Visit | Attending: Family Medicine | Admitting: Family Medicine

## 2022-07-08 DIAGNOSIS — Z1159 Encounter for screening for other viral diseases: Secondary | ICD-10-CM

## 2022-07-08 DIAGNOSIS — Z114 Encounter for screening for human immunodeficiency virus [HIV]: Secondary | ICD-10-CM

## 2022-07-08 DIAGNOSIS — Z113 Encounter for screening for infections with a predominantly sexual mode of transmission: Secondary | ICD-10-CM | POA: Insufficient documentation

## 2022-07-08 DIAGNOSIS — Z0000 Encounter for general adult medical examination without abnormal findings: Secondary | ICD-10-CM | POA: Diagnosis not present

## 2022-07-08 LAB — LIPID PANEL
Cholesterol: 117 mg/dL (ref 0–200)
HDL: 51.7 mg/dL (ref >39.00)
LDL Cholesterol: 56 mg/dL (ref 0–99)
NonHDL: 64.92
Total CHOL/HDL Ratio: 2
Triglycerides: 44 mg/dL (ref 0.0–149.0)
VLDL: 8.8 mg/dL (ref 0.0–40.0)

## 2022-07-08 LAB — COMPREHENSIVE METABOLIC PANEL
ALT: 12 U/L (ref 0–35)
AST: 14 U/L (ref 0–37)
Albumin: 4.2 g/dL (ref 3.5–5.2)
Alkaline Phosphatase: 70 U/L (ref 39–117)
BUN: 6 mg/dL (ref 6–23)
CO2: 27 mEq/L (ref 19–32)
Calcium: 9.6 mg/dL (ref 8.4–10.5)
Chloride: 104 mEq/L (ref 96–112)
Creatinine, Ser: 0.87 mg/dL (ref 0.40–1.20)
GFR: 96.1 mL/min (ref >60.00)
Glucose, Bld: 83 mg/dL (ref 70–99)
Potassium: 4.2 mEq/L (ref 3.5–5.1)
Sodium: 137 mEq/L (ref 135–145)
Total Bilirubin: 0.3 mg/dL (ref 0.2–1.2)
Total Protein: 7.1 g/dL (ref 6.0–8.3)

## 2022-07-08 LAB — CBC
HCT: 41.8 % (ref 36.0–46.0)
Hemoglobin: 14.3 g/dL (ref 12.0–15.0)
MCHC: 34.2 g/dL (ref 30.0–36.0)
MCV: 91.1 fl (ref 78.0–100.0)
Platelets: 245 10*3/uL (ref 150.0–400.0)
RBC: 4.59 Mil/uL (ref 3.87–5.11)
RDW: 12.9 % (ref 11.5–14.6)
WBC: 5.5 10*3/uL (ref 4.5–10.5)

## 2022-07-09 LAB — CERVICOVAGINAL ANCILLARY ONLY
Chlamydia: NEGATIVE
Comment: NEGATIVE
Comment: NORMAL
Neisseria Gonorrhea: NEGATIVE

## 2022-07-09 LAB — HEPATITIS C ANTIBODY: Hepatitis C Ab: NONREACTIVE

## 2022-07-09 LAB — HIV ANTIBODY (ROUTINE TESTING W REFLEX): HIV 1&2 Ab, 4th Generation: NONREACTIVE

## 2022-08-02 ENCOUNTER — Ambulatory Visit (INDEPENDENT_AMBULATORY_CARE_PROVIDER_SITE_OTHER): Payer: Medicaid Other | Admitting: Family Medicine

## 2022-08-02 ENCOUNTER — Encounter: Payer: Self-pay | Admitting: Family Medicine

## 2022-08-02 VITALS — BP 108/80 | HR 83 | Temp 98.5°F | Ht 64.5 in | Wt 186.0 lb

## 2022-08-02 DIAGNOSIS — G8929 Other chronic pain: Secondary | ICD-10-CM | POA: Diagnosis not present

## 2022-08-02 DIAGNOSIS — F411 Generalized anxiety disorder: Secondary | ICD-10-CM

## 2022-08-02 DIAGNOSIS — R109 Unspecified abdominal pain: Secondary | ICD-10-CM | POA: Diagnosis not present

## 2022-08-02 MED ORDER — AMITRIPTYLINE HCL 25 MG PO TABS: 25.0000 mg | ORAL_TABLET | Freq: Every day | ORAL | 2 refills | Status: AC

## 2022-08-02 NOTE — Patient Instructions (Signed)
Send me a message in the middle of next week if headaches get worse.   Consider magnesium 200 mg daily for headaches if needed.  Take Metamucil or Benefiber daily.  Stay active.   Let us know if you need anything.

## 2022-08-02 NOTE — Progress Notes (Signed)
Chief Complaint  Patient presents with   Follow-up    Medication Increased headaches     Subjective: Patient is a 21 y.o. female here for f/u.  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  She was started on Elavil 10 mg daily.  Help with her sleep and overall abdominal symptoms.  She is still having diarrhea and anxiety symptoms with exception of the sleep since then.  She is tolerating the medicine well, but does note she has more frequent headaches, mainly towards the evenings.  She is wondering if this is related to her birth control/hormones.  She has an appoint with a gynecologist next month.  She is working on getting set up with a therapist.  She is starting to lift some weights.  Past Medical History:  Diagnosis Date   No known health problems     Objective: BP 108/80 (BP Location: Left Arm, Patient Position: Sitting, Cuff Size: Normal)   Pulse 83   Temp 98.5 F (36.9 C) (Oral)   Ht 5' 4.5" (1.638 m)   Wt 186 lb (84.4 kg)   SpO2 99%   BMI 31.43 kg/m  General: Awake, appears stated age Heart: RRR, no LE edema Lungs: CTAB, no rales, wheezes or rhonchi. No accessory muscle use Abdomen: Bowel sounds present, mildly tender to palpation diffusely, nontender, nondistended, no masses or organomegaly. Psych: Age appropriate judgment and insight, normal affect and mood  Assessment and Plan: Chronic abdominal pain - Plan: amitriptyline (ELAVIL) 25 MG tablet  GAD (generalized anxiety disorder) - Plan: amitriptyline (ELAVIL) 25 MG tablet  Chronic, unstable.  Increase amitriptyline from 10 mg daily to 25 mg daily.  Consider a daily fiber supplement to help with the diarrhea. Chronic, unstable.  Increase amitriptyline from 10 mg daily to 25 mg daily.  She is getting set up with a counselor.  Counseled on exercise.  Could consider low-dose of an atypical antipsychotic or SSRI if no improvement. Follow-up in 1 month to  recheck above. The patient voiced understanding and agreement to the plan.  [place], DO 08/02/22  9:43 AM

## 2022-09-06 ENCOUNTER — Ambulatory Visit: Payer: Medicaid Other | Admitting: Family Medicine

## 2022-09-09 ENCOUNTER — Ambulatory Visit (INDEPENDENT_AMBULATORY_CARE_PROVIDER_SITE_OTHER): Payer: Medicaid Other | Admitting: Family Medicine

## 2022-09-09 ENCOUNTER — Encounter: Payer: Self-pay | Admitting: Family Medicine

## 2022-09-09 VITALS — BP 107/67 | HR 80 | Wt 185.0 lb

## 2022-09-09 DIAGNOSIS — Z3046 Encounter for surveillance of implantable subdermal contraceptive: Secondary | ICD-10-CM

## 2022-09-09 MED ORDER — ETONOGESTREL 68 MG ~~LOC~~ IMPL
68.0000 mg | DRUG_IMPLANT | Freq: Once | SUBCUTANEOUS | Status: AC
  Administered 2022-09-09: 68 mg via SUBCUTANEOUS

## 2022-09-09 NOTE — Progress Notes (Signed)
     GYNECOLOGY OFFICE PROCEDURE NOTE  Mary Morgan is a 21 y.o. G0P0000 here for Nexplanon removal and Nexplanon insertion.  Last chlam testing 9/23. No pap due to age.  No other gynecologic concerns.   Nexplanon Removal and Reinsertion Patient identified, informed consent performed, consent signed.   Patient does understand that irregular bleeding is a very common side effect of this medication. She was advised to have backup contraception for one week after replacement of the implant. Pregnancy test in clinic today was negative.  Appropriate time out taken. Nexplanon site identified in left arm.  Area prepped in usual sterile fashon. One ml of 1% lidocaine was used to anesthetize the area at the distal end of the implant. A small stab incision was made right beside the implant on the distal portion. The Nexplanon rod was grasped using hemostats and removed without difficulty. There was minimal blood loss. There were no complications. Area was then injected with 3 ml of 1 % lidocaine. She was re-prepped with betadine, Nexplanon removed from packaging, Device confirmed in needle, then inserted full length of needle and withdrawn per handbook instructions. Nexplanon was able to palpated in the patient's arm; patient palpated the insert herself.  There was minimal blood loss. Patient insertion site covered with gauze and a pressure bandage to reduce any bruising. The patient tolerated the procedure well and was given post procedure instructions.  She was advised to have backup contraception for one week.     Reva Bores, MD 09/09/2022 10:50 AM

## 2022-09-10 ENCOUNTER — Ambulatory Visit (INDEPENDENT_AMBULATORY_CARE_PROVIDER_SITE_OTHER): Payer: Medicaid Other | Admitting: Family Medicine

## 2022-09-10 ENCOUNTER — Encounter: Payer: Self-pay | Admitting: Family Medicine

## 2022-09-10 ENCOUNTER — Encounter: Payer: Self-pay | Admitting: General Practice

## 2022-09-10 VITALS — BP 120/82 | HR 87 | Temp 98.2°F | Ht 64.5 in | Wt 185.0 lb

## 2022-09-10 DIAGNOSIS — G8929 Other chronic pain: Secondary | ICD-10-CM

## 2022-09-10 DIAGNOSIS — F411 Generalized anxiety disorder: Secondary | ICD-10-CM | POA: Diagnosis not present

## 2022-09-10 DIAGNOSIS — L503 Dermatographic urticaria: Secondary | ICD-10-CM | POA: Diagnosis not present

## 2022-09-10 DIAGNOSIS — R109 Unspecified abdominal pain: Secondary | ICD-10-CM

## 2022-09-10 DIAGNOSIS — G43009 Migraine without aura, not intractable, without status migrainosus: Secondary | ICD-10-CM | POA: Diagnosis not present

## 2022-09-10 DIAGNOSIS — R6889 Other general symptoms and signs: Secondary | ICD-10-CM | POA: Diagnosis not present

## 2022-09-10 LAB — TSH: TSH: 1.04 u[IU]/mL (ref 0.35–5.50)

## 2022-09-10 MED ORDER — LEVOCETIRIZINE DIHYDROCHLORIDE 5 MG PO TABS: 5.0000 mg | ORAL_TABLET | Freq: Every evening | ORAL | 2 refills | Status: AC

## 2022-09-10 MED ORDER — BUSPIRONE HCL 7.5 MG PO TABS: 7.5000 mg | ORAL_TABLET | Freq: Two times a day (BID) | ORAL | 2 refills | Status: AC

## 2022-09-10 NOTE — Progress Notes (Signed)
Chief Complaint  Patient presents with   followup on medication.  sleeping is better    Discuss other problem    Subjective: Patient is a 21 y.o. female here for f/u.  Patient was being treated for presumed IBS, migraines, and generalized anxiety disorder.  Her Elavil was increased from 10 mg daily to 25 mg daily at the last visit.  She reports improvement of her abdominal symptoms and her migraines have significantly improved as well.  Severity is improved but frequency has not.  She has not started taking magnesium daily yet.  She does have high levels of stress/anxiety still.  Yesterday, she started feeling some sweating and warmth/burning on her skin.  Going into the cold air did not help so she went into a shower and that resolved her symptoms.  She felt weak and that she may pass out.  No fluttering in her chest.  This is happened before where she ends up getting hot and losing consciousness.  She had just eaten recently.  She stays hydrated overall.  No bleeding or diarrhea.  Past Medical History:  Diagnosis Date   No known health problems     Objective: BP 120/82 (BP Location: Left Arm, Patient Position: Sitting, Cuff Size: Normal)   Pulse 87   Temp 98.2 F (36.8 C) (Oral)   Ht 5' 4.5" (1.638 m)   Wt 185 lb (83.9 kg)   LMP 08/23/2022 (Exact Date)   SpO2 99%   BMI 31.26 kg/m  General: Awake, appears stated age Heart: RRR, no LE edema Lungs: CTAB, no rales, wheezes or rhonchi. No accessory muscle use Mouth: MMM Neuro: Gait is normal Psych: Age appropriate judgment and insight, normal affect and mood  Assessment and Plan: Chronic abdominal pain  GAD (generalized anxiety disorder) - Plan: busPIRone (BUSPAR) 7.5 MG tablet  Migraine without aura and without status migrainosus, not intractable  Dermatographism - Plan: levocetirizine (XYZAL) 5 MG tablet  Heat intolerance - Plan: TSH  Chronic, stable.  Continue Elavil 25 mg daily. Chronic, unstable.  Add BuSpar 7.5 mg  twice daily to Elavil.  Follow-up in 1 month to recheck. Chronic, improving.  Continue Elavil 25 mg daily, add magnesium 200 mg daily. Start as needed Daily Xyzal. Check thyroid level.  If this happens again, would consider referral to cardiology. The patient voiced understanding and agreement to the plan.  Jilda Roche [place], DO 09/10/22  9:42 AM

## 2022-09-10 NOTE — Patient Instructions (Addendum)
Give us 2-3 business days to get the results of your labs back.   Keep the diet clean and stay active.  Let us know if you need anything. 

## 2022-10-04 ENCOUNTER — Encounter: Payer: Self-pay | Admitting: Family Medicine

## 2022-10-04 ENCOUNTER — Other Ambulatory Visit: Payer: Self-pay | Admitting: Family Medicine

## 2022-10-09 ENCOUNTER — Ambulatory Visit (INDEPENDENT_AMBULATORY_CARE_PROVIDER_SITE_OTHER): Payer: Medicaid Other | Admitting: Family Medicine

## 2022-10-09 ENCOUNTER — Encounter: Payer: Self-pay | Admitting: Family Medicine

## 2022-10-09 VITALS — BP 120/80 | HR 92 | Temp 98.0°F | Ht 64.5 in | Wt 179.0 lb

## 2022-10-09 DIAGNOSIS — G8929 Other chronic pain: Secondary | ICD-10-CM | POA: Diagnosis not present

## 2022-10-09 DIAGNOSIS — F411 Generalized anxiety disorder: Secondary | ICD-10-CM | POA: Diagnosis not present

## 2022-10-09 DIAGNOSIS — G44209 Tension-type headache, unspecified, not intractable: Secondary | ICD-10-CM

## 2022-10-09 DIAGNOSIS — R109 Unspecified abdominal pain: Secondary | ICD-10-CM

## 2022-10-09 MED ORDER — MIRTAZAPINE 7.5 MG PO TABS: 7.5000 mg | ORAL_TABLET | Freq: Every day | ORAL | 3 refills | Status: AC

## 2022-10-09 NOTE — Patient Instructions (Signed)
Go back on the Elavil.   Consider magnesium 200 mg daily.   Ice/cold pack over area for 10-15 min twice daily.  Heat (pad or rice pillow in microwave) over affected area, 10-15 minutes twice daily.   OK to take Tylenol 1000 mg (2 extra strength tabs) or 975 mg (3 regular strength tabs) every 6 hours as needed.  Let us know if you need anything.  EXERCISES RANGE OF MOTION (ROM) AND STRETCHING EXERCISES  These exercises may help you when beginning to rehabilitate your issue. In order to successfully resolve your symptoms, you must improve your posture. These exercises are designed to help reduce the forward-head and rounded-shoulder posture which contributes to this condition. Your symptoms may resolve with or without further involvement from your physician, physical therapist or athletic trainer. While completing these exercises, remember:  Restoring tissue flexibility helps normal motion to return to the joints. This allows healthier, less painful movement and activity. An effective stretch should be held for at least 20 seconds, although you may need to begin with shorter hold times for comfort. A stretch should never be painful. You should only feel a gentle lengthening or release in the stretched tissue. Do not do any stretch or exercise that you cannot tolerate.  STRETCH- Axial Extensors Lie on your back on the floor. You may bend your knees for comfort. Place a rolled-up hand towel or dish towel, about 2 inches in diameter, under the part of your head that makes contact with the floor. Gently tuck your chin, as if trying to make a "double chin," until you feel a gentle stretch at the base of your head. Hold 15-20 seconds. Repeat 2-3 times. Complete this exercise 1 time per day.   STRETCH - Axial Extension  Stand or sit on a firm surface. Assume a good posture: chest up, shoulders drawn back, abdominal muscles slightly tense, knees unlocked (if standing) and feet hip width  apart. Slowly retract your chin so your head slides back and your chin slightly lowers. Continue to look straight ahead. You should feel a gentle stretch in the back of your head. Be certain not to feel an aggressive stretch since this can cause headaches later. Hold for 15-20 seconds. Repeat 2-3 times. Complete this exercise 1 time per day.  STRETCH - Cervical Side Bend  Stand or sit on a firm surface. Assume a good posture: chest up, shoulders drawn back, abdominal muscles slightly tense, knees unlocked (if standing) and feet hip width apart. Without letting your nose or shoulders move, slowly tip your right / left ear to your shoulder until your feel a gentle stretch in the muscles on the opposite side of your neck. Hold 15-20 seconds. Repeat 2-3 times. Complete this exercise 1-2 times per day.  STRETCH - Cervical Rotators  Stand or sit on a firm surface. Assume a good posture: chest up, shoulders drawn back, abdominal muscles slightly tense, knees unlocked (if standing) and feet hip width apart. Keeping your eyes level with the ground, slowly turn your head until you feel a gentle stretch along the back and opposite side of your neck. Hold 15-20 seconds. Repeat 2-3 times. Complete this exercise 1-2 times per day.  RANGE OF MOTION - Neck Circles  Stand or sit on a firm surface. Assume a good posture: chest up, shoulders drawn back, abdominal muscles slightly tense, knees unlocked (if standing) and feet hip width apart. Gently roll your head down and around from the back of one shoulder to the back  of the other. The motion should never be forced or painful. Repeat the motion 10-20 times, or until you feel the neck muscles relax and loosen. Repeat 2-3 times. Complete the exercise 1-2 times per day. STRENGTHENING EXERCISES - Cervical Strain and Sprain These exercises may help you when beginning to rehabilitate your injury. They may resolve your symptoms with or without further involvement  from your physician, physical therapist, or athletic trainer. While completing these exercises, remember:  Muscles can gain both the endurance and the strength needed for everyday activities through controlled exercises. Complete these exercises as instructed by your physician, physical therapist, or athletic trainer. Progress the resistance and repetitions only as guided. You may experience muscle soreness or fatigue, but the pain or discomfort you are trying to eliminate should never worsen during these exercises. If this pain does worsen, stop and make certain you are following the directions exactly. If the pain is still present after adjustments, discontinue the exercise until you can discuss the trouble with your clinician.  STRENGTH - Cervical Flexors, Isometric Face a wall, standing about 6 inches away. Place a small pillow, a ball about 6-8 inches in diameter, or a folded towel between your forehead and the wall. Slightly tuck your chin and gently push your forehead into the soft object. Push only with mild to moderate intensity, building up tension gradually. Keep your jaw and forehead relaxed. Hold 10 to 20 seconds. Keep your breathing relaxed. Release the tension slowly. Relax your neck muscles completely before you start the next repetition. Repeat 2-3 times. Complete this exercise 1 time per day.  STRENGTH- Cervical Lateral Flexors, Isometric  Stand about 6 inches away from a wall. Place a small pillow, a ball about 6-8 inches in diameter, or a folded towel between the side of your head and the wall. Slightly tuck your chin and gently tilt your head into the soft object. Push only with mild to moderate intensity, building up tension gradually. Keep your jaw and forehead relaxed. Hold 10 to 20 seconds. Keep your breathing relaxed. Release the tension slowly. Relax your neck muscles completely before you start the next repetition. Repeat 2-3 times. Complete this exercise 1 time per  day.  STRENGTH - Cervical Extensors, Isometric  Stand about 6 inches away from a wall. Place a small pillow, a ball about 6-8 inches in diameter, or a folded towel between the back of your head and the wall. Slightly tuck your chin and gently tilt your head back into the soft object. Push only with mild to moderate intensity, building up tension gradually. Keep your jaw and forehead relaxed. Hold 10 to 20 seconds. Keep your breathing relaxed. Release the tension slowly. Relax your neck muscles completely before you start the next repetition. Repeat 2-3 times. Complete this exercise 1 time per day.  POSTURE AND BODY MECHANICS CONSIDERATIONS Keeping correct posture when sitting, standing or completing your activities will reduce the stress put on different body tissues, allowing injured tissues a chance to heal and limiting painful experiences. The following are general guidelines for improved posture. Your physician or physical therapist will provide you with any instructions specific to your needs. While reading these guidelines, remember: The exercises prescribed by your provider will help you have the flexibility and strength to maintain correct postures. The correct posture provides the optimal environment for your joints to work. All of your joints have less wear and tear when properly supported by a spine with good posture. This means you will experience a healthier,  less painful body. Correct posture must be practiced with all of your activities, especially prolonged sitting and standing. Correct posture is as important when doing repetitive low-stress activities (typing) as it is when doing a single heavy-load activity (lifting).  PROLONGED STANDING WHILE SLIGHTLY LEANING FORWARD When completing a task that requires you to lean forward while standing in one place for a long time, place either foot up on a stationary 2- to 4-inch high object to help maintain the best posture. When both feet are  on the ground, the low back tends to lose its slight inward curve. If this curve flattens (or becomes too large), then the back and your other joints will experience too much stress, fatigue more quickly, and can cause pain.   RESTING POSITIONS Consider which positions are most painful for you when choosing a resting position. If you have pain with flexion-based activities (sitting, bending, stooping, squatting), choose a position that allows you to rest in a less flexed posture. You would want to avoid curling into a fetal position on your side. If your pain worsens with extension-based activities (prolonged standing, working overhead), avoid resting in an extended position such as sleeping on your stomach. Most people will find more comfort when they rest with their spine in a more neutral position, neither too rounded nor too arched. Lying on a non-sagging bed on your side with a pillow between your knees, or on your back with a pillow under your knees will often provide some relief. Keep in mind, being in any one position for a prolonged period of time, no matter how correct your posture, can still lead to stiffness.  WALKING Walk with an upright posture. Your ears, shoulders, and hips should all line up. OFFICE WORK When working at a desk, create an environment that supports good, upright posture. Without extra support, muscles fatigue and lead to excessive strain on joints and other tissues.  CHAIR: A chair should be able to slide under your desk when your back makes contact with the back of the chair. This allows you to work closely. The chair's height should allow your eyes to be level with the upper part of your monitor and your hands to be slightly lower than your elbows. Body position: Your feet should make contact with the floor. If this is not possible, use a foot rest. Keep your ears over your shoulders. This will reduce stress on your neck and low back.

## 2022-10-09 NOTE — Progress Notes (Signed)
Chief Complaint  Patient presents with   Follow-up    1 month     Subjective: Patient is a 21 y.o. female here for f/u.  Anxiety- started on BuSpar 7.5 mg bid. Compliant, no AE's. Has not noticed a large difference. Still irritable and emotionally labile. She feels overwhelmed a lot. Has difficulty stay asleep.   Migraines- improved since starting Elavil. Having nightly headaches that actually improved since running out over the past week. No trauma. Associated neck pain on R.   Past Medical History:  Diagnosis Date   No known health problems     Objective: BP 120/80 (BP Location: Left Arm, Patient Position: Sitting, Cuff Size: Normal)   Pulse 92   Temp 98 F (36.7 C) (Oral)   Ht 5' 4.5" (1.638 m)   Wt 179 lb (81.2 kg)   LMP 08/23/2022 (Exact Date)   SpO2 99%   BMI 30.25 kg/m  General: Awake, appears stated age Heart: RRR, no LE edema Lungs: CTAB, no rales, wheezes or rhonchi. No accessory muscle use Abd: BS+, S, NT, ND MSK: Mild ttp over R cerv parasp msc and lateral R neck Psych: Age appropriate judgment and insight, normal affect and mood  Assessment and Plan: Anxiety state - Plan: mirtazapine (REMERON) 7.5 MG tablet  Tension headache  Chronic abdominal pain  Chronic, unstable. Cont Elvail 25 mg/d. Add Remeron 7.5 mg/d. Stop BuSpar. Fu in 1 mo.  Neck stretches/exercises. Heat, ice.  Go back on Elavil.  The patient voiced understanding and agreement to the plan.  Jilda Roche [place], DO 10/09/22  10:15 AM

## 2022-11-05 ENCOUNTER — Encounter: Payer: Self-pay | Admitting: Family Medicine

## 2022-11-05 ENCOUNTER — Other Ambulatory Visit: Payer: Self-pay | Admitting: Family Medicine

## 2022-11-05 DIAGNOSIS — R55 Syncope and collapse: Secondary | ICD-10-CM

## 2022-12-03 ENCOUNTER — Encounter: Payer: Self-pay | Admitting: Internal Medicine

## 2022-12-03 ENCOUNTER — Ambulatory Visit: Payer: Medicaid Other | Attending: Internal Medicine | Admitting: Internal Medicine

## 2022-12-03 VITALS — BP 128/70 | HR 92 | Ht 64.5 in | Wt 189.8 lb

## 2022-12-03 DIAGNOSIS — R55 Syncope and collapse: Secondary | ICD-10-CM | POA: Diagnosis not present

## 2022-12-03 NOTE — Patient Instructions (Addendum)
Medication Instructions:  Your physician recommends that you continue on your current medications as directed. Please refer to the Current Medication list given to you today.  Labwork: CMET, CBC & Urine Osmolality today at [employer] [facility]) Non-fasting  Testing/Procedures: none  Follow-Up: Your physician recommends that you schedule a follow-up appointment in: 6 months  Any Other Special Instructions Will Be Listed Below (If Applicable). Please send our office a mychart message if you get another syncopal episode  If you need a refill on your cardiac medications before your next appointment, please call your pharmacy.

## 2022-12-03 NOTE — Progress Notes (Signed)
Cardiology Office Note  Date: 12/03/2022   ID: Mary Morgan, DOB 12/29/2000, MRN [id number]  PCP:  Shelda Pal, DO  Cardiologist:  None Electrophysiologist:  None   Reason for Office Visit: Syncope at the request of Dr. Nani Ravens   History of Present Illness: Mary Morgan is a 22 y.o. female with no PMH was referred to cardiology clinic for evaluation of syncope.  Patient was in her usual state of health until 1 year ago when she started to have syncopal episodes few times every month. Last episode of syncope was more than 1 month ago.  Patient said her body gets warm/hot and feels nauseous before her vision turns blurry/black. She passes out eventually and lost conscious and is only for a few seconds before she can regain it back. No postictal confusion. No jerky movements of her extremities. These episodes were happening at rest, when she was watching TV at home and not in a crowded environment. Otherwise she denies any palpitations, chest pain, SOB, leg swelling. She lost her smart watch and did not have it when she had these syncopal episodes. Denies smoking cigarettes.  Past Medical History:  Diagnosis Date   No known health problems     Past Surgical History:  Procedure Laterality Date   NO PAST SURGERIES      Current Outpatient Medications  Medication Sig Dispense Refill   clindamycin (CLEOCIN) 150 MG capsule Take 150 mg by mouth in the morning and at bedtime. For Dental Work     etonogestrel (NEXPLANON) 65 MG IMPL implant 1 each by Subdermal route once.     ibuprofen (ADVIL) 200 MG tablet Take 200 mg by mouth every 6 (six) hours as needed.     levocetirizine (XYZAL) 5 MG tablet Take 1 tablet (5 mg total) by mouth every evening. 30 tablet 2   mirtazapine (REMERON) 7.5 MG tablet Take 1 tablet (7.5 mg total) by mouth at bedtime. 30 tablet 3   acetaminophen-codeine (TYLENOL #3) 300-30 MG tablet Take 1 tablet by mouth every 4 (four) hours as needed  for moderate pain. (Patient not taking: Reported on 12/03/2022)     amitriptyline (ELAVIL) 25 MG tablet Take 1 tablet (25 mg total) by mouth at bedtime. (Patient not taking: Reported on 12/03/2022) 30 tablet 2   busPIRone (BUSPAR) 7.5 MG tablet Take 1 tablet (7.5 mg total) by mouth 2 (two) times daily. (Patient not taking: Reported on 12/03/2022) 60 tablet 2   No current facility-administered medications for this visit.   Allergies:  Banana, Penicillins, and Elemental sulfur   Social History: The patient  reports that she has never smoked. She has never used smokeless tobacco. She reports that she does not currently use alcohol. She reports that she does not use drugs.   Family History: The patient's family history includes ADD / ADHD in her brother; Anxiety disorder in her mother; Bipolar disorder in her mother; Cervical cancer in her mother; Depression in her mother; Diabetes in her father; Migraines in her brother and mother; Schizophrenia in her mother.   ROS:  Please see the history of present illness. Otherwise, complete review of systems is positive for none.  All other systems are reviewed and negative.   Physical Exam: VS:  BP 128/70   Pulse 92   Ht 5' 4.5" (1.638 m)   Wt 189 lb 12.8 oz (86.1 kg)   SpO2 98%   BMI 32.08 kg/m , BMI Body mass index is 32.08 kg/m.  Wt  Readings from Last 3 Encounters:  12/03/22 189 lb 12.8 oz (86.1 kg)  10/09/22 179 lb (81.2 kg)  09/10/22 185 lb (83.9 kg)    General: Patient appears comfortable at rest. HEENT: Conjunctiva and lids normal, oropharynx clear with moist mucosa. Neck: Supple, no elevated JVP or carotid bruits, no thyromegaly. Lungs: Clear to auscultation, nonlabored breathing at rest. Cardiac: Regular rate and rhythm, no S3 or significant systolic murmur, no pericardial rub. Abdomen: Soft, nontender, no hepatomegaly, bowel sounds present, no guarding or rebound. Extremities: No pitting edema, distal pulses 2+. Skin: Warm and  dry. Musculoskeletal: No kyphosis. Neuropsychiatric: Alert and oriented x3, affect grossly appropriate.  ECG:  An ECG dated 12/03/2022 was personally reviewed today and demonstrated:  Normal sinus rhythm and no ST-T changes  Recent Labwork: 07/08/2022: ALT 12; AST 14; BUN 6; Creatinine, Ser 0.87; Hemoglobin 14.3; Platelets 245.0; Potassium 4.2; Sodium 137 09/10/2022: TSH 1.04     Component Value Date/Time   CHOL 117 07/08/2022 0915   TRIG 44.0 07/08/2022 0915   HDL 51.70 07/08/2022 0915   CHOLHDL 2 07/08/2022 0915   VLDL 8.8 07/08/2022 0915   LDLCALC 56 07/08/2022 0915    Other Studies Reviewed Today:   Assessment and Plan: Patient is a 22 year old F with no PMH was referred to cardiology clinic for evaluation of syncope.  # Syncope unlikely cardiac -Patient was in her usual state of health until 1 year ago when she started to have syncopal episodes few times every month. Last episode of syncope was more than 1 month ago. Patient said her body gets warm/hot and feels nauseous before her vision turns blurry/black. She passes out eventually and lost conscious and is only for a few seconds before she can regain it back. No postictal confusion. No jerky movements of her extremities. These episodes were happening at rest, when she was watching TV at home and not in a crowded environment. Obtain routine labs, CBC, CMP, osmolality, addendum, labs normal. Syncopal episodes do not sound cardiac in etiology. Instructed her to wear smart watch and monitor HR. Will refer patient to EP if recurrent syncopal episodes for tilt table testing.  I have spent a total of 30 minutes with patient reviewing chart, EKGs, labs and examining patient as well as establishing an assessment and plan that was discussed with the patient.  > 50% of time was spent in direct patient care.    Medication Adjustments/Labs and Tests Ordered: Current medicines are reviewed at length with the patient today.  Concerns regarding  medicines are outlined above.   Tests Ordered: Orders Placed This Encounter  Procedures   EKG 12-Lead    Medication Changes: No orders of the defined types were placed in this encounter.   Disposition:  Follow up  6 months  Signed Orly Quimby Fidel Levy, MD, 12/03/2022 10:07 AM    [facility], [place], [place] [address]

## 2022-12-05 LAB — CBC
Hematocrit: 44 % (ref 34.0–46.6)
Hemoglobin: 14.9 g/dL (ref 11.1–15.9)
MCH: 30.7 pg (ref 26.6–33.0)
MCHC: 33.9 g/dL (ref 31.5–35.7)
MCV: 91 fL (ref 79–97)
Platelets: 264 10*3/uL (ref 150–450)
RBC: 4.86 x10E6/uL (ref 3.77–5.28)
RDW: 12.4 % (ref 11.7–15.4)
WBC: 7.4 10*3/uL (ref 3.4–10.8)

## 2022-12-05 LAB — COMPREHENSIVE METABOLIC PANEL
ALT: 12 IU/L (ref 0–32)
AST: 17 IU/L (ref 0–40)
Albumin/Globulin Ratio: 1.8 (ref 1.2–2.2)
Albumin: 4.5 g/dL (ref 4.0–5.0)
Alkaline Phosphatase: 77 IU/L (ref 42–106)
BUN/Creatinine Ratio: 10 (ref 9–23)
BUN: 9 mg/dL (ref 6–20)
Bilirubin Total: 0.2 mg/dL (ref 0.0–1.2)
CO2: 21 mmol/L (ref 20–29)
Calcium: 9.5 mg/dL (ref 8.7–10.2)
Chloride: 105 mmol/L (ref 96–106)
Creatinine, Ser: 0.88 mg/dL (ref 0.57–1.00)
Globulin, Total: 2.5 g/dL (ref 1.5–4.5)
Glucose: 76 mg/dL (ref 70–99)
Potassium: 4.1 mmol/L (ref 3.5–5.2)
Sodium: 141 mmol/L (ref 134–144)
Total Protein: 7 g/dL (ref 6.0–8.5)
eGFR: 96 mL/min/{1.73_m2} (ref >59)

## 2022-12-05 LAB — OSMOLALITY, URINE: Osmolality, Ur: 694 mOsmol/kg

## 2022-12-12 DIAGNOSIS — R55 Syncope and collapse: Secondary | ICD-10-CM | POA: Insufficient documentation

## 2022-12-16 DIAGNOSIS — R21 Rash and other nonspecific skin eruption: Secondary | ICD-10-CM | POA: Diagnosis not present

## 2022-12-16 DIAGNOSIS — E669 Obesity, unspecified: Secondary | ICD-10-CM | POA: Diagnosis not present

## 2022-12-16 DIAGNOSIS — Z6831 Body mass index (BMI) 31.0-31.9, adult: Secondary | ICD-10-CM | POA: Diagnosis not present

## 2023-02-07 IMAGING — US US PELVIS COMPLETE TRANSABD/TRANSVAG W DUPLEX AND/OR DOPPLER
1 series · 13 of 25 positions shown · non-contrast
Comparison: CT abdomen pelvis 02/26/2022

CLINICAL DATA: Pelvic pain.  Abnormal CT

EXAM:
TRANSABDOMINAL ULTRASOUND OF PELVIS
DOPPLER ULTRASOUND OF OVARIES
TECHNIQUE: Transabdominal ultrasound examination of the pelvis was performed
including evaluation of the uterus, ovaries, adnexal regions, and
pelvic cul-de-sac.
Color and duplex Doppler ultrasound was utilized to evaluate blood
flow to the ovaries.

[Series 1: us pelvis (transabdominal only) · 13 of 73 slices shown]
[im 1/73]
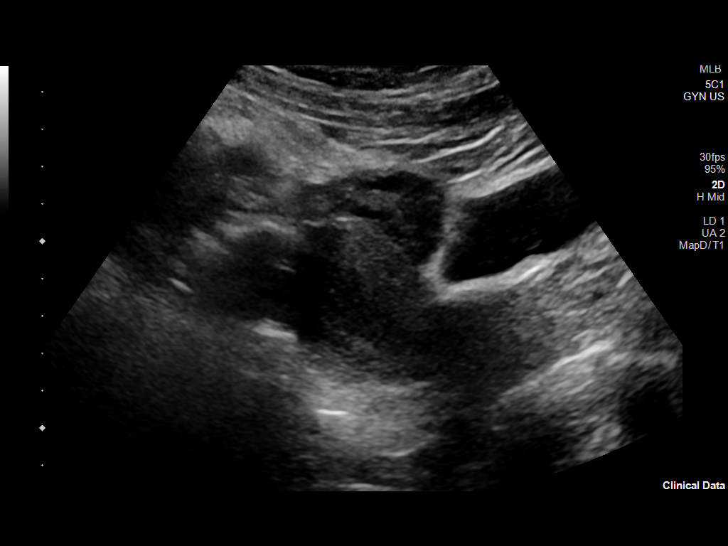
[im 7/73]
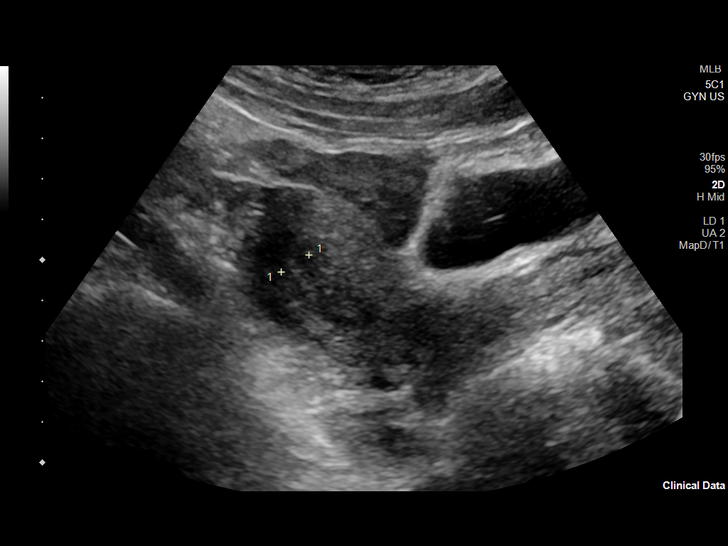
[im 13/73]
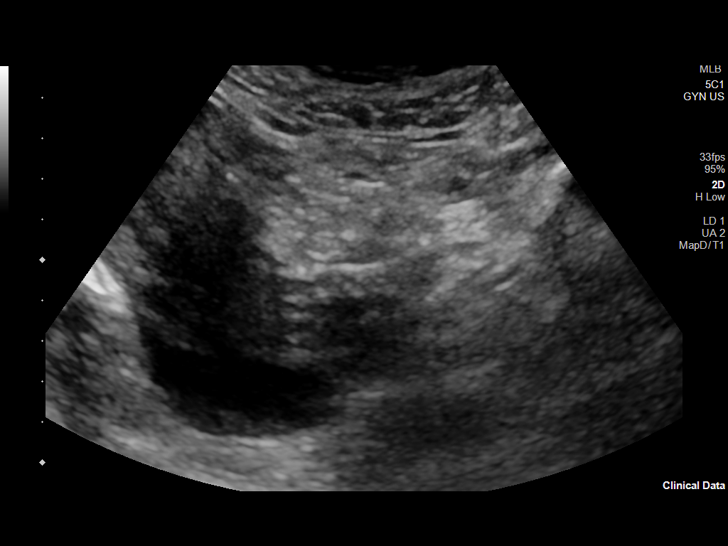
[im 19/73]
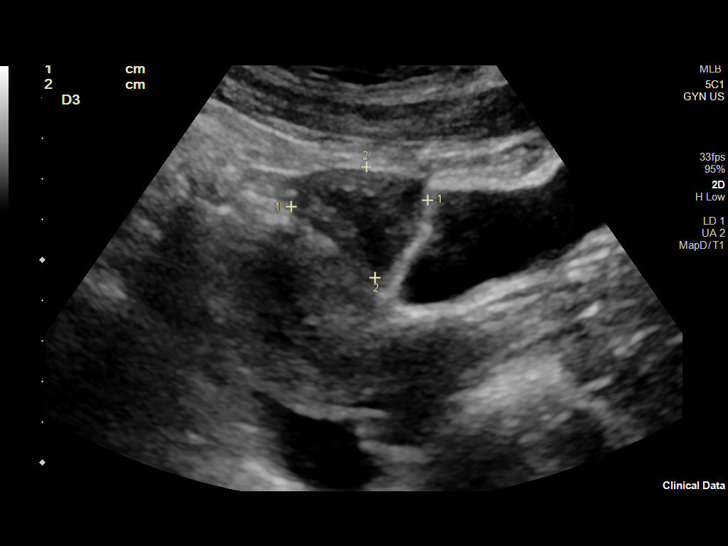
[im 25/73]
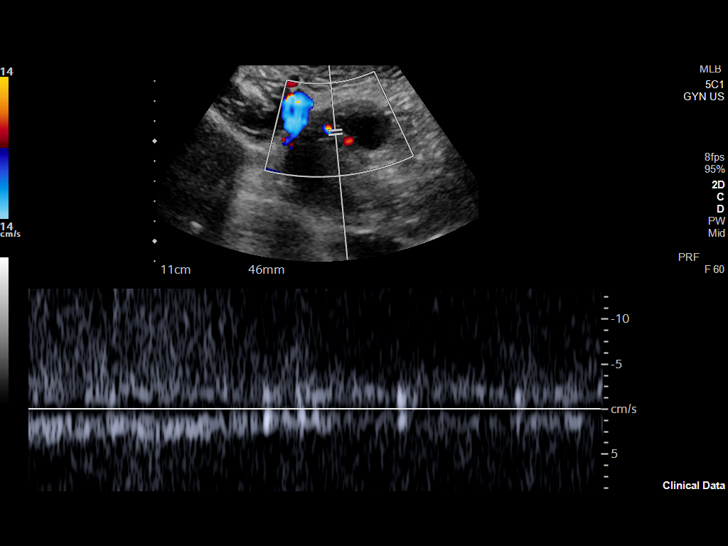
[im 31/73]
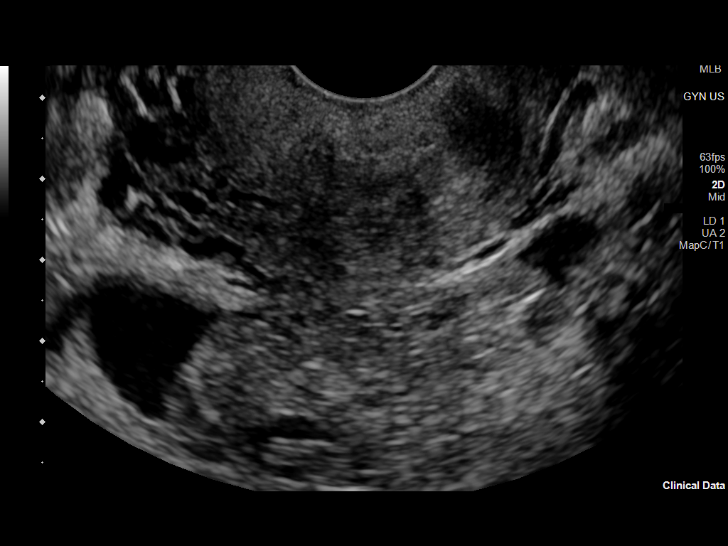
[im 37/73]
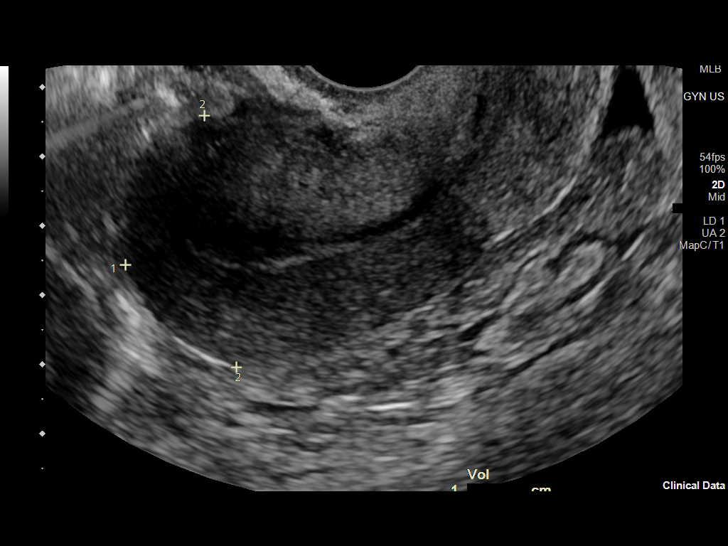
[im 43/73]
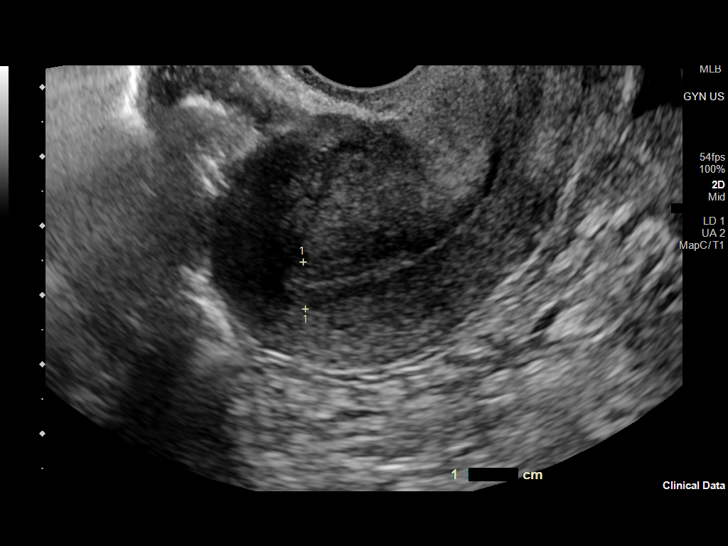
[im 49/73]
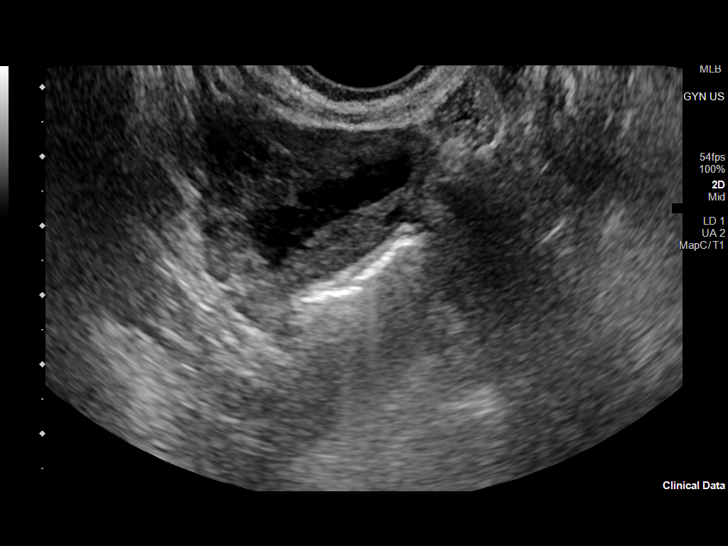
[im 55/73]
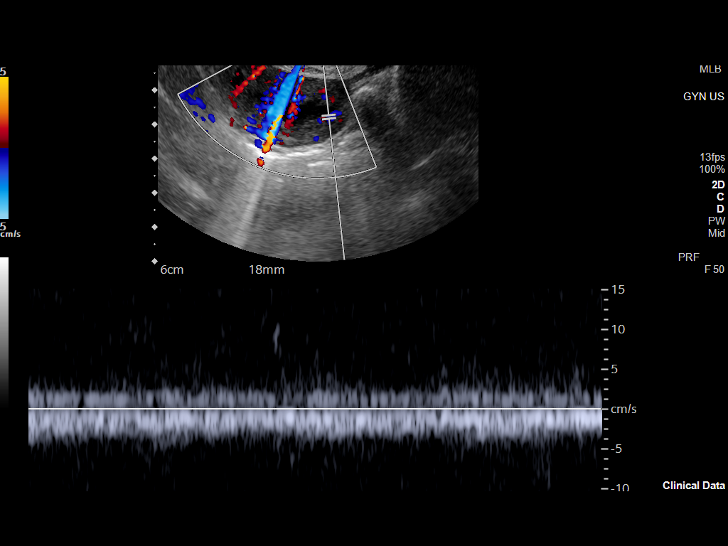
[im 61/73]
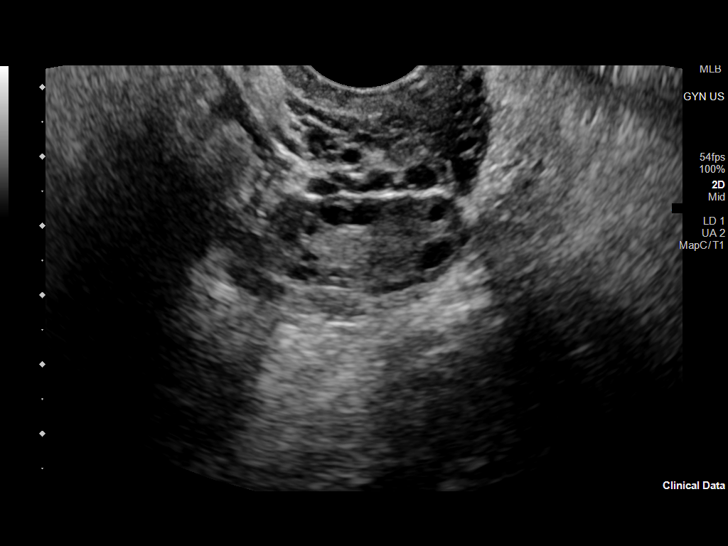
[im 67/73]
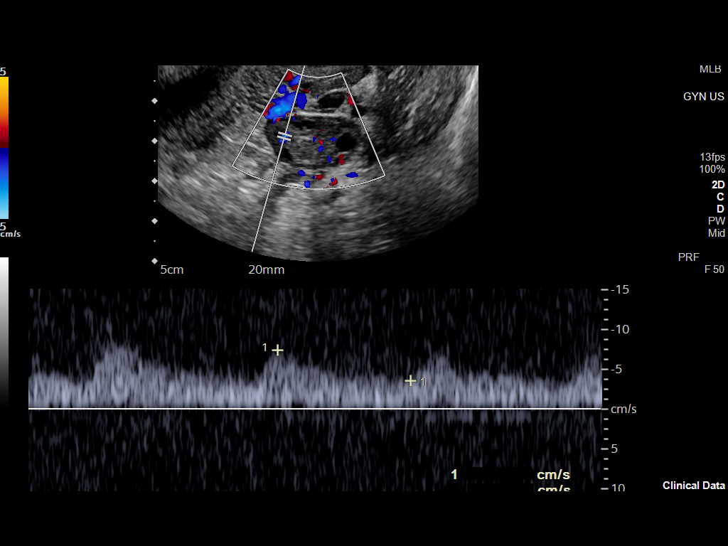
[im 73/73]
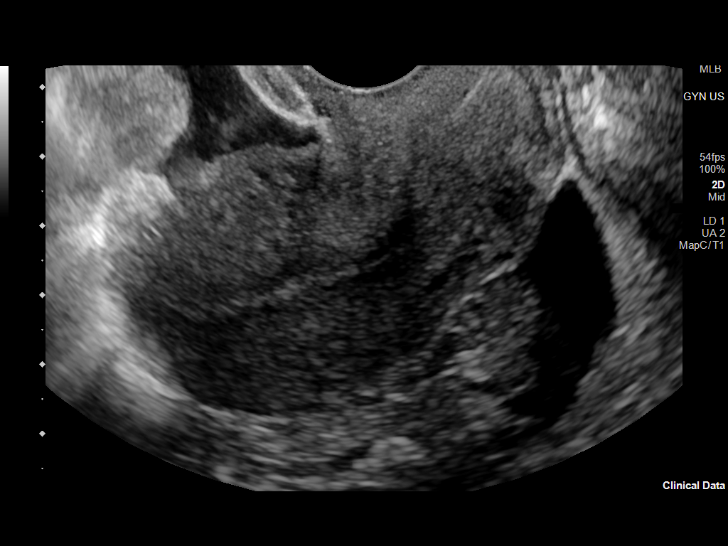

[13 of 25 positions shown; findings below may reference images not displayed]

FINDINGS: Uterus

Measurements: 7.4 x 3.7 x 3.9 cm = volume: 56 mL. No fibroids or
other mass visualized.

Endometrium

Thickness: 6.8 mm.  No focal abnormality visualized.

Right ovary

Measurements: 3.4 x 2.5 x 3.7 cm = volume: 16.6 mL. Cystic change
within the right ovary measuring 3 x 1.0 x 2.5 cm. Hypervascularity
on Doppler consistent with corpus luteum cyst.

Left ovary

Measurements: 3.1 x 1.4 x 2.3 cm = volume: 5.2 mL. Normal
appearance/no adnexal mass.

Pulsed Doppler evaluation demonstrates hypervascularity in the right
ovary. Normal vascularity left ovary. Negative for torsion

Other: Small free fluid in the pelvis
IMPRESSION: Mild enlargement of the right ovary. Internal cystic change with
hypervascularity compatible with corpus luteum cyst. This
corresponds to the CT finding. Small free fluid.

## 2023-02-11 ENCOUNTER — Encounter: Payer: Self-pay | Admitting: Internal Medicine

## 2023-03-13 ENCOUNTER — Telehealth: Payer: Self-pay

## 2023-03-13 NOTE — Telephone Encounter (Signed)
Called patient to schedule annual appointment with a pap smear. Left voicemail with our contact information to call back and schedule.

## 2023-05-29 ENCOUNTER — Ambulatory Visit: Payer: Medicaid Other | Admitting: Internal Medicine
